# Patient Record
Sex: Female | Born: 1965 | Race: White | Hispanic: No | Marital: Single | Smoking: Never smoker
Health system: Southern US, Community
[De-identification: ages and names within clinical notes are randomized; demographics above are authoritative.]

## PROBLEM LIST (undated history)

## (undated) DIAGNOSIS — D259 Leiomyoma of uterus, unspecified: Secondary | ICD-10-CM

## (undated) DIAGNOSIS — K602 Anal fissure, unspecified: Secondary | ICD-10-CM

## (undated) DIAGNOSIS — F329 Major depressive disorder, single episode, unspecified: Secondary | ICD-10-CM

## (undated) DIAGNOSIS — F32A Depression, unspecified: Secondary | ICD-10-CM

## (undated) DIAGNOSIS — F419 Anxiety disorder, unspecified: Secondary | ICD-10-CM

## (undated) DIAGNOSIS — F909 Attention-deficit hyperactivity disorder, unspecified type: Secondary | ICD-10-CM

## (undated) DIAGNOSIS — K219 Gastro-esophageal reflux disease without esophagitis: Secondary | ICD-10-CM

## (undated) DIAGNOSIS — E785 Hyperlipidemia, unspecified: Secondary | ICD-10-CM

## (undated) HISTORY — PX: OTHER SURGICAL HISTORY: SHX169

## (undated) HISTORY — DX: Attention-deficit hyperactivity disorder, unspecified type: F90.9

## (undated) HISTORY — DX: Hyperlipidemia, unspecified: E78.5

## (undated) HISTORY — DX: Anxiety disorder, unspecified: F41.9

## (undated) HISTORY — PX: TONSILLECTOMY: SUR1361

## (undated) HISTORY — DX: Gastro-esophageal reflux disease without esophagitis: K21.9

## (undated) HISTORY — PX: UTERINE FIBROID SURGERY: SHX826

## (undated) HISTORY — DX: Major depressive disorder, single episode, unspecified: F32.9

## (undated) HISTORY — DX: Depression, unspecified: F32.A

## (undated) HISTORY — DX: Anal fissure, unspecified: K60.2

---

## 2007-10-02 ENCOUNTER — Emergency Department: Payer: Self-pay | Admitting: Emergency Medicine

## 2011-04-17 ENCOUNTER — Other Ambulatory Visit: Payer: Self-pay | Admitting: Internal Medicine

## 2011-04-17 DIAGNOSIS — Z1231 Encounter for screening mammogram for malignant neoplasm of breast: Secondary | ICD-10-CM

## 2011-12-25 ENCOUNTER — Ambulatory Visit
Admission: RE | Admit: 2011-12-25 | Discharge: 2011-12-25 | Disposition: A | Discharge status: Home / Self Care | Payer: Managed Care, Other (non HMO) | Source: Ambulatory Visit | Attending: Internal Medicine | Admitting: Internal Medicine

## 2011-12-25 DIAGNOSIS — Z1231 Encounter for screening mammogram for malignant neoplasm of breast: Secondary | ICD-10-CM

## 2011-12-31 ENCOUNTER — Other Ambulatory Visit: Payer: Self-pay | Admitting: Internal Medicine

## 2011-12-31 DIAGNOSIS — R928 Other abnormal and inconclusive findings on diagnostic imaging of breast: Secondary | ICD-10-CM

## 2012-01-06 ENCOUNTER — Ambulatory Visit
Admission: RE | Admit: 2012-01-06 | Discharge: 2012-01-06 | Disposition: A | Discharge status: Home / Self Care | Payer: Managed Care, Other (non HMO) | Source: Ambulatory Visit | Attending: Internal Medicine | Admitting: Internal Medicine

## 2012-01-06 DIAGNOSIS — R928 Other abnormal and inconclusive findings on diagnostic imaging of breast: Secondary | ICD-10-CM

## 2012-01-07 ENCOUNTER — Other Ambulatory Visit: Payer: Managed Care, Other (non HMO)

## 2013-02-16 ENCOUNTER — Ambulatory Visit: Payer: Self-pay | Admitting: Obstetrics and Gynecology

## 2013-02-16 LAB — PREGNANCY, URINE: Pregnancy Test, Urine: NEGATIVE m[IU]/mL

## 2013-02-17 ENCOUNTER — Ambulatory Visit: Payer: Self-pay | Admitting: Obstetrics and Gynecology

## 2014-10-17 ENCOUNTER — Encounter (HOSPITAL_COMMUNITY): Payer: Self-pay | Admitting: Emergency Medicine

## 2014-10-17 ENCOUNTER — Emergency Department (HOSPITAL_COMMUNITY)
Admission: EM | Admit: 2014-10-17 | Discharge: 2014-10-17 | Disposition: A | Discharge status: Home / Self Care | Payer: Managed Care, Other (non HMO) | Attending: Emergency Medicine | Admitting: Emergency Medicine

## 2014-10-17 DIAGNOSIS — R110 Nausea: Secondary | ICD-10-CM | POA: Diagnosis not present

## 2014-10-17 DIAGNOSIS — Z859 Personal history of malignant neoplasm, unspecified: Secondary | ICD-10-CM | POA: Diagnosis not present

## 2014-10-17 DIAGNOSIS — B029 Zoster without complications: Secondary | ICD-10-CM | POA: Insufficient documentation

## 2014-10-17 DIAGNOSIS — Z79899 Other long term (current) drug therapy: Secondary | ICD-10-CM | POA: Insufficient documentation

## 2014-10-17 DIAGNOSIS — R1031 Right lower quadrant pain: Secondary | ICD-10-CM | POA: Diagnosis present

## 2014-10-17 DIAGNOSIS — R21 Rash and other nonspecific skin eruption: Secondary | ICD-10-CM | POA: Insufficient documentation

## 2014-10-17 HISTORY — DX: Leiomyoma of uterus, unspecified: D25.9

## 2014-10-17 MED ORDER — GABAPENTIN 600 MG PO TABS
300.0000 mg | ORAL_TABLET | Freq: Once | ORAL | Status: DC
  Filled 2014-10-17: qty 0.5

## 2014-10-17 MED ORDER — VALACYCLOVIR HCL 1 G PO TABS: 1000.0000 mg | ORAL_TABLET | Freq: Three times a day (TID) | ORAL | Status: DC

## 2014-10-17 MED ORDER — GABAPENTIN 300 MG PO CAPS: 300.0000 mg | ORAL_CAPSULE | Freq: Once | ORAL | Status: DC

## 2014-10-17 MED ORDER — HYDROMORPHONE HCL 1 MG/ML IJ SOLN
2.0000 mg | Freq: Once | INTRAMUSCULAR | Status: AC
  Administered 2014-10-17: 2 mg via INTRAMUSCULAR
  Filled 2014-10-17: qty 2

## 2014-10-17 MED ORDER — ONDANSETRON 4 MG PO TBDP
4.0000 mg | ORAL_TABLET | Freq: Once | ORAL | Status: AC
  Administered 2014-10-17: 4 mg via ORAL
  Filled 2014-10-17: qty 1

## 2014-10-17 MED ORDER — VALACYCLOVIR HCL 500 MG PO TABS
1000.0000 mg | ORAL_TABLET | Freq: Once | ORAL | Status: AC
  Administered 2014-10-17: 1000 mg via ORAL
  Filled 2014-10-17: qty 2

## 2014-10-17 MED ORDER — OXYCODONE-ACETAMINOPHEN 5-325 MG PO TABS: 1.0000 | ORAL_TABLET | ORAL | Status: DC | PRN

## 2014-10-17 MED ORDER — ONDANSETRON 4 MG PO TBDP: 4.0000 mg | ORAL_TABLET | Freq: Three times a day (TID) | ORAL | Status: DC | PRN

## 2014-10-17 MED ORDER — GABAPENTIN 300 MG PO CAPS
300.0000 mg | ORAL_CAPSULE | Freq: Once | ORAL | Status: AC
  Administered 2014-10-17: 300 mg via ORAL
  Filled 2014-10-17: qty 1

## 2014-10-17 NOTE — ED Provider Notes (Signed)
  Face-to-face evaluation   History: She is here for evaluation of right flank pain that has been present for about one week.  In the last day she has noticed a rash.  The pain is described as "searing".  She has already been evaluated with a CT scan that was negative.  She is not responding to Norco, taken at home.  Physical exam: Alert, calm, uncomfortable. Right  L2-3 dermatomal vesicular rash on red base, consistent with shingles  Medical screening examination/treatment/procedure(s) were conducted as a shared visit with non-physician practitioner(s) and myself.  I personally evaluated the patient during the encounter   Richarda Blade, MD 10/18/14 1031

## 2014-10-17 NOTE — ED Notes (Signed)
Pt reports RLQ abd pain x 1 week. Pt reports that she went to [facility] to be evaluated for the same on Friday and was told nothing was abnormal on her CT scan. Pt now has a small red rash above the area of pain. Pt alert x4.

## 2014-10-17 NOTE — ED Provider Notes (Signed)
CSN: [id number]     Arrival date & time 10/17/14  1660 History   First MD Initiated Contact with Patient 10/17/14 0945     Chief Complaint  Patient presents with  . Abdominal Pain     (Consider location/radiation/quality/duration/timing/severity/associated sxs/prior Treatment) HPI Pt is a 48yo female presenting to ED with c/o gradually worsening RLQ pain that started about 1 week ago. Pt was seen on Friday, 12/4 at [facility] where she had a negative abdominal CT scan.  Pt states she developed a erythematous rash over the area of tenderness 1 week ago.  Pain is constant, burning, aching, stinging, 10/10, not relieved with Norco provided to her by [facility]. Reports intermittent mild nausea but no vomiting. Denies fever or chills. Denies urinary or vaginal symptoms.  Pt does report having chicken pox when she was younger. No known sick contacts or recent travel. Pt is otherwise healthy. Denies hx of immunodeficiency or diabetes.   Past Medical History  Diagnosis Date  . Uterine fibroid    Past Surgical History  Procedure Laterality Date  . Bladder polyp removal     . Tonsillectomy     No family history on file. History  Substance Use Topics  . Smoking status: Never Smoker   . Smokeless tobacco: Not on file  . Alcohol Use: Yes     Comment: occasionally    OB History    No data available     Review of Systems  Constitutional: Negative for fever, chills and appetite change.  Respiratory: Negative for cough and shortness of breath.   Cardiovascular: Negative for chest pain and palpitations.  Gastrointestinal: Positive for nausea and abdominal pain ( RLQ). Negative for vomiting and diarrhea.  Genitourinary: Negative for dysuria, urgency, frequency, hematuria, flank pain and pelvic pain.  All other systems reviewed and are negative.     Allergies  Morphine and related and Sulfamethoxazole-trimethoprim  Home Medications   Prior to Admission medications     Medication Sig Start Date End Date Taking? Authorizing Provider  amphetamine-dextroamphetamine (ADDERALL XR) 30 MG 24 hr capsule Take 30 mg by mouth daily.   Yes Historical Provider, MD  buPROPion (WELLBUTRIN XL) 150 MG 24 hr tablet Take 150 mg by mouth daily.   Yes Historical Provider, MD  gabapentin (NEURONTIN) 300 MG capsule Take 1 capsule (300 mg total) by mouth once. 300 mg orally on day 1, 300 mg twice daily on day 2, and 300 mg 3 times daily on day 3.  May increase to 1800mg /day (3 divided doses-6 tabs total) 10/17/14   Noland Fordyce, PA-C  HYDROcodone-acetaminophen (NORCO/VICODIN) 5-325 MG per tablet Take 1 tablet by mouth every 6 (six) hours as needed for moderate pain.   Yes Historical Provider, MD  ibuprofen (ADVIL,MOTRIN) 200 MG tablet Take 200 mg by mouth every 6 (six) hours as needed for mild pain.   Yes Historical Provider, MD  ondansetron (ZOFRAN) 4 MG tablet Take 4 mg by mouth every 8 (eight) hours as needed for nausea or vomiting.   Yes Historical Provider, MD  ondansetron (ZOFRAN-ODT) 4 MG disintegrating tablet Take 1 tablet (4 mg total) by mouth every 8 (eight) hours as needed for nausea or vomiting. 10/17/14   Noland Fordyce, PA-C  oxyCODONE-acetaminophen (PERCOCET/ROXICET) 5-325 MG per tablet Take 1-2 tablets by mouth every 4 (four) hours as needed for moderate pain or severe pain. 10/17/14   Noland Fordyce, PA-C  valACYclovir (VALTREX) 1000 MG tablet Take 1 tablet (1,000 mg total) by mouth 3 (three)  times daily. For 7 days 10/17/14   Noland Fordyce, PA-C  zolpidem (AMBIEN CR) 6.25 MG CR tablet Take 6.25 mg by mouth at bedtime as needed for sleep.   Yes Historical Provider, MD   BP 109/64 mmHg  Pulse 70  Temp(Src) 97.8 F (36.6 C) (Oral)  Resp 18  SpO2 99%  LMP 11/17/2013 Physical Exam  Constitutional: She appears well-developed and well-nourished. She appears distressed.  Tearful lying in exam bed, appears uncomfortable.  HENT:  Head: Normocephalic and atraumatic.  Eyes:  Conjunctivae are normal. No scleral icterus.  Neck: Normal range of motion. Neck supple.  Cardiovascular: Normal rate, regular rhythm and normal heart sounds.   Pulmonary/Chest: Effort normal and breath sounds normal. No respiratory distress. She has no wheezes. She has no rales. She exhibits no tenderness.  Abdominal: Soft. Bowel sounds are normal. She exhibits no distension and no mass. There is tenderness. There is no rebound and no guarding.  Rash along RLQ, see skin exam  Musculoskeletal: Normal range of motion. She exhibits tenderness. She exhibits no edema.  Neurological: She is alert.  Skin: Skin is warm and dry. Rash noted. She is not diaphoretic. There is erythema.     Vesicular, erythematous, grouping papular rash along L2 dermatome over RLQ, tender to touch and surround right side of abdomen c/w shingles  Nursing note and vitals reviewed.   ED Course  Procedures (including critical care time) Labs Review Labs Reviewed - No data to display  Imaging Review No results found.   EKG Interpretation None      MDM   Final diagnoses:  Herpes zoster  Shingles rash    pt is a 48yo female presenting to ED with c/o 1 week hx of gradually worsening RLQ pain with intermittent nausea, developed a painful rash yesterday. Normal CT abdomen and workup at [facility] on Friday, 10/13/14 per pt.  Pt is otherwise healthy. On exam, rash is c/w herpes zoster. Rash involves the L2 dermatome over RLQ and into right lower back.  Pt is tearful and uncomfrotable but afebrile, and non-toxic appearing.  Discussed pt with Dr. Eulis Foster, no indication for further evaluation with blood work or imaging as pt recently had negative workup, additional testing would not be of benefit as pain most likely due to herpes zoster.  Pt given IM dilaudid, zofran, gabapentin, and started on valtrex in ED.  Pain improved from 10/10 to 4/10 while in ED. Pt states she feels comfortable being discharged home. Will  discharge home with valtrex, gabapentin, percocet, and zofran. Home care instructions provided. Advised to f/u with her PCP 1 week if not improving. Advised pt it may take several weeks for pain to completely resolve. Discussed return precautions regarding secondary infection. Pt verbalized understanding and agreement with tx plan.   Noland Fordyce, PA-C 10/17/14 [place], MD 10/18/14 1031

## 2015-03-02 NOTE — Op Note (Signed)
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   PROCEDURE:  Right Bartholin's cyst marsupialization.   SURGEON: Will Bonnet, MD   ASSISTANT SURGEON: Barnett Applebaum, MD   ANESTHESIA:  MAC.  ESTIMATED BLOOD LOSS:  Minimal.   OPERATIVE FLUIDS:  600 mL crystalloid.   COMPLICATIONS: None.   FINDINGS:  Right Bartholin's cyst measuring approximately 2 x 2 cm, draining.  SPECIMENS:  None.  CONDITION:  Stable at the end of the procedure.   INDICATIONS FOR PROCEDURE: Nancy Kirk is a 49 year old female who has been seen numerous times in clinic for a recurrent right Bartholin's gland cyst and abscess.  She has undergone a Word catheter placement which remained successfully in place for a total of 4 weeks and was removed after 4 weeks.  She had some time where she had had no recurrence and then has had several recurrences which prior to surgical intervention have begun draining and were not amenable to surgical treatment.  She presented to the office yesterday in a great deal of pain, strongly desiring surgical management and was, therefore, taken to the operating room today.    PROCEDURE IN DETAIL:  After the patient was met in the preoperative area and her questions were answered, she was taken to the operating room.  There, she was placed under a monitored-type anesthesia which was found to be adequate.  She was then positioned in the dorsal supine high lithotomy position in the candy cane stirrups and then prepped and draped in the usual sterile fashion.  After a timeout was called, approximately 4 mL of a mixture of 1% lidocaine and 0.25% benzocaine without epinephrine and with a 1:1 ratio was injected in the overlying tissue of  interest over an approximately 2 cm x 2 cm area. An incision was made, and the cavity was entered.  An elliptical excision measuring approximately 1.5 x 1 cm was made unroofing the cyst. The epithelium of the cyst was then identified, and this was sutured to the surrounding vaginal and vulvar epithelium using 3-0 Vicryl interrupted sutures.  A total of approximately 8 sutures were thrown with good hemostasis noted.  The depth of the cyst cavity was approximately 1.5 cm, and it had been drained of all of its contents.  At this point, the procedure was terminated.    The patient tolerated the procedure well. Sponge, lap and needle counts were correct x2.  For VTE prophylaxis, she was wearing pneumatic compression stockings throughout the entire procedure.  She was awakened and taken to the recovery area in stable condition.        ____________________________ Will Bonnet, MD [email or web address] D: 02/17/2013 13:57:00 ET T: 02/17/2013 14:06:22 ET JOB#: [id number]  cc: Will Bonnet, MD, <Dictator> Will Bonnet MD ELECTRONICALLY SIGNED 03/10/2013 8:42

## 2015-07-06 ENCOUNTER — Other Ambulatory Visit: Payer: Self-pay

## 2015-07-06 DIAGNOSIS — F331 Major depressive disorder, recurrent, moderate: Secondary | ICD-10-CM

## 2015-07-06 MED ORDER — ZOLPIDEM TARTRATE ER 12.5 MG PO TBCR: 12.5000 mg | EXTENDED_RELEASE_TABLET | Freq: Every evening | ORAL | Status: DC | PRN

## 2015-07-06 NOTE — Telephone Encounter (Signed)
left message that rx called into pharmacy

## 2015-07-06 NOTE — Telephone Encounter (Signed)
received a fax requesting a refill on zolpidem er 12.5mg  . pt last seen on  03-27-15 rx was last sent on 03-30-15 x 2 refill. pt next appt 07-24-15.

## 2015-07-06 NOTE — Telephone Encounter (Signed)
called in rx order.  tried 3 times to fax order and each time it failed.  fax # correct. spoke with the pharmist "Pilar Plate"

## 2015-07-24 ENCOUNTER — Encounter: Payer: Self-pay | Admitting: Psychiatry

## 2015-07-24 ENCOUNTER — Ambulatory Visit (INDEPENDENT_AMBULATORY_CARE_PROVIDER_SITE_OTHER): Payer: 59 | Admitting: Psychiatry

## 2015-07-24 VITALS — BP 118/86 | HR 76 | Temp 98.0°F | Ht 64.0 in | Wt 127.0 lb

## 2015-07-24 DIAGNOSIS — F331 Major depressive disorder, recurrent, moderate: Secondary | ICD-10-CM | POA: Diagnosis not present

## 2015-07-24 DIAGNOSIS — G4700 Insomnia, unspecified: Secondary | ICD-10-CM | POA: Insufficient documentation

## 2015-07-24 MED ORDER — AMPHETAMINE-DEXTROAMPHET ER 30 MG PO CP24: 30.0000 mg | ORAL_CAPSULE | Freq: Every day | ORAL | Status: DC

## 2015-07-24 MED ORDER — ZOLPIDEM TARTRATE ER 12.5 MG PO TBCR: 12.5000 mg | EXTENDED_RELEASE_TABLET | Freq: Every evening | ORAL | Status: DC | PRN

## 2015-07-24 MED ORDER — BUPROPION HCL ER (XL) 150 MG PO TB24: 150.0000 mg | ORAL_TABLET | Freq: Every day | ORAL | Status: DC

## 2015-07-24 NOTE — Progress Notes (Signed)
[place] MD/PA/NP OP Progress Note  07/24/2015 10:58 AM JYL CHICO  MRN:  [id number]  Subjective:  Patient returns a follow-up of her ADHD, and major depressive disorder, recurrent moderate. She states that things are "same." She states that anxiety not gotten any worse. She states that she continues to go to work and finds it a high pressure job. 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 She states she has been active and done some high aches but is having some lower back pain which has slowed her down. She states she has not gone to the gym secondary to the back pain but desires to return to that activity. She states that her sleep is good with Ambien. She states that her energy is good. She states she continues to use the Adderall and it helps her focus. She states she does sometimes take a break from using on the weekends. She estimates that she uses the Ambien 5 nights a week. Chief Complaint: same Chief Complaint    Follow-up; Medication Refill     Visit Diagnosis:     ICD-9-CM ICD-10-CM   1. Major depressive disorder, recurrent episode, moderate 296.32 F33.1 zolpidem (AMBIEN CR) 12.5 MG CR tablet    Past Medical History:  Past Medical History  Diagnosis Date  . Uterine fibroid     Past Surgical History  Procedure Laterality Date  . Bladder polyp removal     . Tonsillectomy     Family History: No family history on file. Social History:  Social History   Social History  . Marital Status: Single    Spouse Name: N/A  . Number of Children: N/A  . Years of Education: N/A   Social History Main Topics  . Smoking status: Never Smoker   . Smokeless tobacco: Never Used  . Alcohol Use: 0.0 oz/week    0 Standard drinks or equivalent per week     Comment: occasionally   . Drug Use: No  . Sexual Activity: Not Asked   Other Topics Concern  . None   Social History Narrative   Additional History:   Assessment:   Musculoskeletal: Strength & Muscle  Tone: within normal limits Gait & Station: normal Patient leans: N/A  Psychiatric Specialty Exam: HPI  Review of Systems  Psychiatric/Behavioral: Negative for depression, suicidal ideas, hallucinations, memory loss and substance abuse. The patient is not nervous/anxious and does not have insomnia.     Last menstrual period 11/17/2013.There is no height or weight on file to calculate BMI.  General Appearance: Well Groomed  Eye Contact:  Good  Speech:  Normal Rate  Volume:  Normal  Mood:  Same  Affect:  Negative  Thought Process:  Linear  Orientation:  Full (Time, Place, and Person)  Thought Content:  Negative  Suicidal Thoughts:  No  Homicidal Thoughts:  No  Memory:  Immediate;   Good Recent;   Good Remote;   Good  Judgement:  Good  Insight:  Good  Psychomotor Activity:  Negative  Concentration:  Good  Recall:  Good  Fund of Knowledge: Good  Language: Good  Akathisia:  Negative  Handed:  Right  AIMS (if indicated):  N/A  Assets:  Communication Skills Desire for Improvement Vocational/Educational  ADL's:  Intact  Cognition: WNL  Sleep:  Good with ambien   Is the patient at risk to self?  No. Has the patient been a risk to self in the past 6 months?  No. Has the patient  been a risk to self within the distant past?  No. Is the patient a risk to others?  No. Has the patient been a risk to others in the past 6 months?  No. Has the patient been a risk to others within the distant past?  No.  Current Medications: Current Outpatient Prescriptions  Medication Sig Dispense Refill  . amphetamine-dextroamphetamine (ADDERALL XR) 30 MG 24 hr capsule Take 1 capsule (30 mg total) by mouth daily. 30 capsule 0  . buPROPion (WELLBUTRIN XL) 150 MG 24 hr tablet Take 1 tablet (150 mg total) by mouth daily. 30 tablet 4  . gabapentin (NEURONTIN) 300 MG capsule Take 1 capsule (300 mg total) by mouth once. 300 mg orally on day 1, 300 mg twice daily on day 2, and 300 mg 3 times daily on day  3.  May increase to 1800mg /day (3 divided doses-6 tabs total) 30 capsule 0  . HYDROcodone-acetaminophen (NORCO/VICODIN) 5-325 MG per tablet Take 1 tablet by mouth every 6 (six) hours as needed for moderate pain.    Marland Kitchen ibuprofen (ADVIL,MOTRIN) 200 MG tablet Take 200 mg by mouth every 6 (six) hours as needed for mild pain.    Marland Kitchen ondansetron (ZOFRAN) 4 MG tablet Take 4 mg by mouth every 8 (eight) hours as needed for nausea or vomiting.    . ondansetron (ZOFRAN-ODT) 4 MG disintegrating tablet Take 1 tablet (4 mg total) by mouth every 8 (eight) hours as needed for nausea or vomiting. 20 tablet 0  . oxyCODONE-acetaminophen (PERCOCET/ROXICET) 5-325 MG per tablet Take 1-2 tablets by mouth every 4 (four) hours as needed for moderate pain or severe pain. 20 tablet 0  . valACYclovir (VALTREX) 1000 MG tablet Take 1 tablet (1,000 mg total) by mouth 3 (three) times daily. For 7 days 20 tablet 0  . zolpidem (AMBIEN CR) 12.5 MG CR tablet Take 1 tablet (12.5 mg total) by mouth at bedtime as needed for sleep. 30 tablet 4   No current facility-administered medications for this visit.    Medical Decision Making:  Established Problem, Stable/Improving (1) and Review of Medication Regimen & Side Effects (2)  Treatment Plan Summary:Medication management and Plan Patient's major depressive disorder and ADHD and remains stable. We will continue her Wellbutrin XL 150 mg in the morning, Ambien CR 12.5 mg of bedtime and Adderall XR 30 mg daily. Patient follow up in 4 months. She's encouraged colony questions as prior to next point. She is aware she needs to call ahead to get her prescriptions for Adderall X are. I have given her 1 prescription today with an additional perception for her to fill in October. She left call for additional prescriptions for pickup.   Faith Rogue 07/24/2015, 10:58 AM

## 2015-11-23 ENCOUNTER — Ambulatory Visit (INDEPENDENT_AMBULATORY_CARE_PROVIDER_SITE_OTHER): Payer: 59 | Admitting: Psychiatry

## 2015-11-23 ENCOUNTER — Encounter: Payer: Self-pay | Admitting: Psychiatry

## 2015-11-23 VITALS — BP 110/80 | HR 80 | Temp 97.0°F | Ht 64.0 in | Wt 131.6 lb

## 2015-11-23 DIAGNOSIS — F331 Major depressive disorder, recurrent, moderate: Secondary | ICD-10-CM

## 2015-11-23 MED ORDER — BUSPIRONE HCL 10 MG PO TABS: ORAL_TABLET | ORAL | Status: DC

## 2015-11-23 MED ORDER — BUPROPION HCL ER (XL) 150 MG PO TB24: 150.0000 mg | ORAL_TABLET | Freq: Every day | ORAL | Status: DC

## 2015-11-23 MED ORDER — ZOLPIDEM TARTRATE ER 12.5 MG PO TBCR: 12.5000 mg | EXTENDED_RELEASE_TABLET | Freq: Every evening | ORAL | Status: DC | PRN

## 2015-11-23 MED ORDER — AMPHETAMINE-DEXTROAMPHET ER 30 MG PO CP24: 30.0000 mg | ORAL_CAPSULE | Freq: Every day | ORAL | Status: DC

## 2015-11-23 NOTE — Progress Notes (Signed)
[place] MD/PA/NP OP Progress Note  11/23/2015 12:25 PM Nancy Kirk  MRN:  [id number]  Subjective:  Patient returns a follow-up of her ADHD, and major depressive disorder, recurrent moderate. She states that the major stressor for her is that she has now ended a four-year relationship. She made contact with the [facility] clinic where she had a prior referral to by her previous psychiatrist. Saw someone in their couple of times but the cost and insurance coverage was an issue. She states that there is someone there that might be less expensive and she is trying to reconnect with them. She states that she wants to see why she might be drawn to significant others that are "narcissistic."  Is that her medications continue to work well for her. She does states she's having some anxiety related to the breakup and she requests BuSpar. She has been on BuSpar in the past and believes her previous dose was 15 mg twice a day. Chief Complaint: Anxiety Chief Complaint    Follow-up; Medication Refill     Visit Diagnosis:     ICD-9-CM ICD-10-CM   1. Major depressive disorder, recurrent episode, moderate (HCC) 296.32 F33.1 zolpidem (AMBIEN CR) 12.5 MG CR tablet    Past Medical History:  Past Medical History  Diagnosis Date  . Uterine fibroid   . ADHD (attention deficit hyperactivity disorder)   . Anxiety   . Depression     Past Surgical History  Procedure Laterality Date  . Bladder polyp removal     . Tonsillectomy     Family History:  Family History  Problem Relation Age of Onset  . Hypertension Mother   . Hyperlipidemia Mother   . Depression Mother   . Anxiety disorder Mother    Social History:  Social History   Social History  . Marital Status: Single    Spouse Name: N/A  . Number of Children: N/A  . Years of Education: N/A   Social History Main Topics  . Smoking status: Never Smoker   . Smokeless tobacco: Never Used  . Alcohol Use: 1.8 oz/week    0 Standard drinks or equivalent, 3  Glasses of wine, 0 Cans of beer, 0 Shots of liquor per week     Comment: occasionally   . Drug Use: No  . Sexual Activity: Yes    Birth Control/ Protection: None   Other Topics Concern  . None   Social History Narrative   Additional History:   Assessment:   Musculoskeletal: Strength & Muscle Tone: within normal limits Gait & Station: normal Patient leans: N/A  Psychiatric Specialty Exam: HPI  Review of Systems  Psychiatric/Behavioral: Negative for depression, suicidal ideas, hallucinations, memory loss and substance abuse. The patient is nervous/anxious. The patient does not have insomnia.   All other systems reviewed and are negative.   Blood pressure 110/80, pulse 80, temperature 97 F (36.1 C), temperature source Tympanic, height 5\' 4"  (1.626 m), weight 131 lb 9.6 oz (59.693 kg), last menstrual period 11/17/2013, SpO2 99 %.Body mass index is 22.58 kg/(m^2).  General Appearance: Well Groomed  Eye Contact:  Good  Speech:  Normal Rate  Volume:  Normal  Mood:  anxiety  Affect:  Constricted  Thought Process:  Linear  Orientation:  Full (Time, Place, and Person)  Thought Content:  Negative  Suicidal Thoughts:  No  Homicidal Thoughts:  No  Memory:  Immediate;   Good Recent;   Good Remote;   Good  Judgement:  Good  Insight:  Good  Psychomotor Activity:  Negative  Concentration:  Good  Recall:  Good  Fund of Knowledge: Good  Language: Good  Akathisia:  Negative  Handed:  Right  AIMS (if indicated):  N/A  Assets:  Communication Skills Desire for Improvement Vocational/Educational  ADL's:  Intact  Cognition: WNL  Sleep:  Good with ambien   Is the patient at risk to self?  No. Has the patient been a risk to self in the past 6 months?  No. Has the patient been a risk to self within the distant past?  No. Is the patient a risk to others?  No. Has the patient been a risk to others in the past 6 months?  No. Has the patient been a risk to others within the distant  past?  No.  Current Medications: Current Outpatient Prescriptions  Medication Sig Dispense Refill  . amphetamine-dextroamphetamine (ADDERALL XR) 30 MG 24 hr capsule Take 1 capsule (30 mg total) by mouth daily. 30 capsule 0  . buPROPion (WELLBUTRIN XL) 150 MG 24 hr tablet Take 1 tablet (150 mg total) by mouth daily. 30 tablet 4  . ibuprofen (ADVIL,MOTRIN) 200 MG tablet Take 200 mg by mouth every 6 (six) hours as needed for mild pain.    . traMADol (ULTRAM) 50 MG tablet TK 1 T PO Q 6 TO 8 H PRN P  1  . zolpidem (AMBIEN CR) 12.5 MG CR tablet Take 1 tablet (12.5 mg total) by mouth at bedtime as needed for sleep. 30 tablet 4  . busPIRone (BUSPAR) 10 MG tablet Take one half a tablet twice daily for one week then increase to one whole tablet twice daily. 60 tablet 4   No current facility-administered medications for this visit.    Medical Decision Making:  Established Problem, Stable/Improving (1) and Review of Medication Regimen & Side Effects (2)  Treatment Plan Summary:Medication management and Plan Patient's   major depressive disorder-we will continue the Wellbutrin XL and Ambien CR 12.5 mg at bedtime.   ADHD continue Adderall XR 30 mg daily. He has been given a prescription for this month and additional prescription for February. She is aware after that she'll have to contact the clinic to get additional refills.   Adjustment disorder with anxious features some BuSpar 5 mg twice a day for 1 week and then she'll go to 10 mg twice a day. Risk and benefits have been discussing patient's able to consent. Patient has been on medication before with some benefit.  Patient is already making efforts to obtain therapy to address relationship issues. I've also given her a list of therapists in the area and given her the contact information for her therapist in this clinic should her current pursuit of therapy as noted above not be successful.  He is aware of my departure in February. She's been  encouraged call any questions or concerns prior to next point. She is aware she'll follow-up with another provider within this clinic. She prefers to follow up in 3 months.  Faith Rogue 11/23/2015, 12:25 PM

## 2016-02-21 ENCOUNTER — Ambulatory Visit: Payer: 59 | Admitting: Psychiatry

## 2016-03-03 ENCOUNTER — Encounter: Payer: Self-pay | Admitting: Psychiatry

## 2016-03-03 ENCOUNTER — Ambulatory Visit: Payer: 59 | Admitting: Psychiatry

## 2016-03-03 ENCOUNTER — Ambulatory Visit (INDEPENDENT_AMBULATORY_CARE_PROVIDER_SITE_OTHER): Payer: 59 | Admitting: Psychiatry

## 2016-03-03 VITALS — BP 118/82 | HR 67 | Temp 97.0°F | Ht 64.0 in | Wt 135.6 lb

## 2016-03-03 DIAGNOSIS — F988 Other specified behavioral and emotional disorders with onset usually occurring in childhood and adolescence: Secondary | ICD-10-CM

## 2016-03-03 DIAGNOSIS — F909 Attention-deficit hyperactivity disorder, unspecified type: Secondary | ICD-10-CM | POA: Diagnosis not present

## 2016-03-03 DIAGNOSIS — F331 Major depressive disorder, recurrent, moderate: Secondary | ICD-10-CM

## 2016-03-03 MED ORDER — ZOLPIDEM TARTRATE ER 12.5 MG PO TBCR: 12.5000 mg | EXTENDED_RELEASE_TABLET | Freq: Every evening | ORAL | Status: DC | PRN

## 2016-03-03 MED ORDER — BUSPIRONE HCL 10 MG PO TABS
10.0000 mg | ORAL_TABLET | Freq: Two times a day (BID) | ORAL | Status: DC
Start: 2016-03-03 — End: 2016-08-11

## 2016-03-03 MED ORDER — BUPROPION HCL ER (XL) 150 MG PO TB24: 150.0000 mg | ORAL_TABLET | Freq: Every day | ORAL | Status: DC

## 2016-03-03 MED ORDER — AMPHETAMINE-DEXTROAMPHET ER 30 MG PO CP24: 30.0000 mg | ORAL_CAPSULE | Freq: Every day | ORAL | Status: DC

## 2016-03-03 NOTE — Progress Notes (Signed)
BH MD/PA/NP OP Progress Note  03/03/2016 10:56 AM Nancy Kirk  MRN:  [id number]  Subjective:  Patient  is a 50 year old female who presented for the follow-up appointment. She was previously following Dr. Jimmye Norman. 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 She reported that she has history of depression and ADHD. She is currently stable on her regimen of medications. She does not take Adderall on a regular basis. She sleeps well with the help of Ambien. Patient reported that she does not have any acute stressors at this time. She went to assist in [place] to watch the opera over the weekend. She currently lives with her mother. She stated that she used to follow with Dr. Nicolasa Ducking in the past but she did not like her. She appeared calm and collective during the interview. She currently denied having any suicidal ideations or plans. She reported that she does not have any issues with her anger anxiety or paranoia.    Chief Complaint: Anxiety Chief Complaint    Follow-up; Medication Refill     Visit Diagnosis:     ICD-9-CM ICD-10-CM   1. Major depressive disorder, recurrent episode, moderate (HCC) 296.32 F33.1   2. ADD (attention deficit disorder) 314.00 F90.9     Past Medical History:  Past Medical History  Diagnosis Date  . Uterine fibroid   . ADHD (attention deficit hyperactivity disorder)   . Anxiety   . Depression     Past Surgical History  Procedure Laterality Date  . Bladder polyp removal     . Tonsillectomy     Family History:  Family History  Problem Relation Age of Onset  . Hypertension Mother   . Hyperlipidemia Mother   . Depression Mother   . Anxiety disorder Mother    Social History:  Social History   Social History  . Marital Status: Single    Spouse Name: N/A  . Number of Children: N/A  . Years of Education: N/A   Social History Main Topics  . Smoking status: Never Smoker   . Smokeless tobacco: Never Used  . Alcohol Use: 1.8 oz/week    0 Standard drinks or equivalent, 3 Glasses of wine, 0 Cans of beer, 0 Shots of liquor per week     Comment: occasionally   . Drug Use: No  . Sexual Activity: Yes    Birth Control/ Protection: None   Other Topics Concern  . None   Social History Narrative   Additional History:   Assessment:   Musculoskeletal: Strength & Muscle Tone: within normal limits Gait & Station: normal Patient leans: N/A  Psychiatric Specialty Exam: HPI  Review of Systems  Psychiatric/Behavioral: Negative for depression, suicidal ideas, hallucinations, memory loss and substance abuse. The patient is nervous/anxious. The patient does not have insomnia.   All other systems reviewed and are negative.   Blood pressure 118/82, pulse 67, temperature 97 F (36.1 C), temperature source Tympanic, height 5\' 4"  (1.626 m), weight 135 lb 9.6 oz (61.508 kg), last menstrual period 11/17/2013, SpO2 98 %.Body mass index is 23.26 kg/(m^2).  General Appearance: Well Groomed  Eye Contact:  Good  Speech:  Normal Rate  Volume:  Normal  Mood:  anxiety  Affect:  Constricted  Thought Process:  Linear  Orientation:  Full (Time, Place, and Person)  Thought Content:  Negative  Suicidal Thoughts:  No  Homicidal Thoughts:  No  Memory:  Immediate;   Good Recent;   Good Remote;   Good  Judgement:  Good  Insight:  Good  Psychomotor Activity:  Negative  Concentration:  Good  Recall:  Good  Fund of Knowledge: Good  Language: Good  Akathisia:  Negative  Handed:  Right  AIMS (if indicated):  N/A  Assets:  Communication Skills Desire for Improvement Vocational/Educational  ADL's:  Intact  Cognition: WNL  Sleep:  Good with ambien   Is the patient at risk to self?  No. Has the patient been a risk to self in the past 6 months?  No. Has the patient been a risk to self within the distant past?  No. Is the patient a risk to others?  No. Has the patient been a risk to others in the past 6 months?  No. Has the patient been a  risk to others within the distant past?  No.  Current Medications: Current Outpatient Prescriptions  Medication Sig Dispense Refill  . amphetamine-dextroamphetamine (ADDERALL XR) 30 MG 24 hr capsule Take 1 capsule (30 mg total) by mouth daily. 30 capsule 0  . buPROPion (WELLBUTRIN XL) 150 MG 24 hr tablet Take 1 tablet (150 mg total) by mouth daily. 30 tablet 4  . busPIRone (BUSPAR) 10 MG tablet Take one half a tablet twice daily for one week then increase to one whole tablet twice daily. 60 tablet 4  . ibuprofen (ADVIL,MOTRIN) 200 MG tablet Take 200 mg by mouth every 6 (six) hours as needed for mild pain.    . traMADol (ULTRAM) 50 MG tablet TK 1 T PO Q 6 TO 8 H PRN P  1  . zolpidem (AMBIEN CR) 12.5 MG CR tablet Take 1 tablet (12.5 mg total) by mouth at bedtime as needed for sleep. 30 tablet 4   No current facility-administered medications for this visit.    Medical Decision Making:  Established Problem, Stable/Improving (1) and Review of Medication Regimen & Side Effects (2)  Treatment Plan Summary:Medication management and Plan Patient's   major depressive disorder-we will continue the Wellbutrin XL 150mg  po qam  and Ambien CR 12.5 mg at bedtime.   ADHD continue Adderall XR 30 mg daily.    She will continue with BuSpar 10 mg by mouth twice a day for her anxiety symptoms  Discussed with patient about her medications in detail. She reported that she is currently stable and does not want to make any adjustments.   More than 50% of the time spent in psychoeducation, counseling and coordination of care.    This note was generated in part or whole with voice recognition software. Voice regonition is usually quite accurate but there are transcription errors that can and very often do occur. I apologize for any typographical errors that were not detected and corrected.   Rainey Pines, MD    03/03/2016, 10:56 AM

## 2016-04-02 ENCOUNTER — Ambulatory Visit: Payer: 59 | Admitting: Psychiatry

## 2016-04-29 ENCOUNTER — Other Ambulatory Visit: Payer: Self-pay | Admitting: Internal Medicine

## 2016-04-29 DIAGNOSIS — Z1231 Encounter for screening mammogram for malignant neoplasm of breast: Secondary | ICD-10-CM

## 2016-05-15 ENCOUNTER — Ambulatory Visit
Admission: RE | Admit: 2016-05-15 | Discharge: 2016-05-15 | Disposition: A | Discharge status: Home / Self Care | Payer: Managed Care, Other (non HMO) | Source: Ambulatory Visit | Attending: Internal Medicine | Admitting: Internal Medicine

## 2016-05-15 DIAGNOSIS — Z1231 Encounter for screening mammogram for malignant neoplasm of breast: Secondary | ICD-10-CM

## 2016-08-11 ENCOUNTER — Ambulatory Visit (INDEPENDENT_AMBULATORY_CARE_PROVIDER_SITE_OTHER): Payer: 59 | Admitting: Psychiatry

## 2016-08-11 ENCOUNTER — Encounter: Payer: Self-pay | Admitting: Psychiatry

## 2016-08-11 VITALS — BP 109/70 | HR 71 | Temp 97.4°F | Wt 134.2 lb

## 2016-08-11 DIAGNOSIS — F331 Major depressive disorder, recurrent, moderate: Secondary | ICD-10-CM | POA: Diagnosis not present

## 2016-08-11 MED ORDER — ZOLPIDEM TARTRATE ER 6.25 MG PO TBCR: 6.2500 mg | EXTENDED_RELEASE_TABLET | Freq: Every evening | ORAL | 0 refills | Status: DC | PRN

## 2016-08-11 MED ORDER — ESCITALOPRAM OXALATE 10 MG PO TABS: 10.0000 mg | ORAL_TABLET | Freq: Every day | ORAL | 0 refills | Status: DC

## 2016-08-11 NOTE — Progress Notes (Signed)
BH MD/PA/NP OP Progress Note  08/11/2016 10:38 AM Nancy Kirk  MRN:  [id number]  Subjective:  Patient  is a 50 year old female who presented for the follow-up appointment. She was not evaluated since April. She reported that she was on the short-term leave because of the back pain. She started working again. She reported that she is currently on pain medications and is getting epidural injections as well as tramadol. She is not taking any psychotropic medications at this time. Patient came back for a follow-up appointment and is requesting refill of her medications. I checked her [place] controlled drug Registry She has been filling the Ambien and was minimizing the use. She reported that she might have old prescriptions of the medications as Dr. Jimmye Norman must have given her refills on the medication.   She reported that she has anxiety and she has discussed with her primary care physician about the anxiety. She is interested in switching the medications for her anxiety at this time. We discussed about starting her on Lexapro and she agreed. Then she started asking for the refill of Adderall. Patient has not refilled her Adderall in many months per the [facility] control drug registry. I offered her other medications including Strattera but she did not agree. She stated that she is struggling at work and wants the Adderall. She does not appear to have any symptoms of ADD at this time. 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  She currently denied having any suicidal ideations or plans. She reported that she does not have any issues with her anger mood symptoms  or paranoia.    Chief Complaint: Anxiety Chief Complaint    Follow-up; Medication Refill     Visit Diagnosis:     ICD-9-CM ICD-10-CM   1. MDD (major depressive disorder), recurrent episode, moderate (HCC) 296.32 F33.1     Past Medical History:  Past Medical History:  Diagnosis  Date  . ADHD (attention deficit hyperactivity disorder)   . Anxiety   . Depression   . Uterine fibroid     Past Surgical History:  Procedure Laterality Date  . bladder polyp removal     . TONSILLECTOMY     Family History:  Family History  Problem Relation Age of Onset  . Hypertension Mother   . Hyperlipidemia Mother   . Depression Mother   . Anxiety disorder Mother    Social History:  Social History   Social History  . Marital status: Single    Spouse name: N/A  . Number of children: N/A  . Years of education: N/A   Social History Main Topics  . Smoking status: Never Smoker  . Smokeless tobacco: Never Used  . Alcohol use 1.8 oz/week    3 Glasses of wine per week     Comment: occasionally   . Drug use: No  . Sexual activity: Yes    Birth control/ protection: None   Other Topics Concern  . None   Social History Narrative  . None   Additional History:   Assessment:   Musculoskeletal: Strength & Muscle Tone: within normal limits Gait & Station: normal Patient leans: N/A  Psychiatric Specialty Exam: HPI  Review of Systems  Psychiatric/Behavioral: Negative for depression, hallucinations, memory loss, substance abuse and suicidal ideas. The patient is nervous/anxious. The patient does not have insomnia.   All other systems reviewed and are negative.   Blood pressure 109/70, pulse 71, temperature 97.4 F (36.3  C), temperature source Oral, weight 134 lb 3.2 oz (60.9 kg), last menstrual period 11/17/2013.Body mass index is 23.04 kg/m.  General Appearance: Well Groomed  Eye Contact:  Good  Speech:  Normal Rate  Volume:  Normal  Mood:  anxiety  Affect:  Constricted  Thought Process:  Linear  Orientation:  Full (Time, Place, and Person)  Thought Content:  Negative  Suicidal Thoughts:  No  Homicidal Thoughts:  No  Memory:  Immediate;   Good Recent;   Good Remote;   Good  Judgement:  Good  Insight:  Good  Psychomotor Activity:  Negative  Concentration:   Good  Recall:  Good  Fund of Knowledge: Good  Language: Good  Akathisia:  Negative  Handed:  Right  AIMS (if indicated):  N/A  Assets:  Communication Skills Desire for Improvement Vocational/Educational  ADL's:  Intact  Cognition: WNL  Sleep:  Good with ambien   Is the patient at risk to self?  No. Has the patient been a risk to self in the past 6 months?  No. Has the patient been a risk to self within the distant past?  No. Is the patient a risk to others?  No. Has the patient been a risk to others in the past 6 months?  No. Has the patient been a risk to others within the distant past?  No.  Current Medications: Current Outpatient Prescriptions  Medication Sig Dispense Refill  . traMADol (ULTRAM) 50 MG tablet TK 1 T PO Q 6 TO 8 H PRN P  1  . escitalopram (LEXAPRO) 10 MG tablet Take 1 tablet (10 mg total) by mouth daily. 30 tablet 0  . zolpidem (AMBIEN CR) 6.25 MG CR tablet Take 1 tablet (6.25 mg total) by mouth at bedtime as needed for sleep. 30 tablet 0   No current facility-administered medications for this visit.     Medical Decision Making:  Established Problem, Stable/Improving (1) and Review of Medication Regimen & Side Effects (2)  Treatment Plan Summary:Medication management and Plan Patient's   She will be started on Lexapro 10 mg every daily to help with her anxiety symptoms. She will  given a prescription of Ambien CR 6.25 mg to help with her insomnia. I offered her Strattera to help with her ADD symptoms but she declined at this time.  Follow-up in one month or earlier depending on the symptoms   More than 50% of the time spent in psychoeducation, counseling and coordination of care.    This note was generated in part or whole with voice recognition software. Voice regonition is usually quite accurate but there are transcription errors that can and very often do occur. I apologize for any typographical errors that were not detected and corrected.   Rainey Pines, MD    08/11/2016, 10:38 AM

## 2016-09-07 ENCOUNTER — Other Ambulatory Visit: Payer: Self-pay | Admitting: Psychiatry

## 2016-12-01 ENCOUNTER — Encounter: Payer: Self-pay | Admitting: Emergency Medicine

## 2016-12-01 ENCOUNTER — Emergency Department: Payer: Managed Care, Other (non HMO)

## 2016-12-01 ENCOUNTER — Emergency Department
Admission: EM | Admit: 2016-12-01 | Discharge: 2016-12-01 | Disposition: A | Discharge status: Home / Self Care | Payer: Managed Care, Other (non HMO) | Attending: Emergency Medicine | Admitting: Emergency Medicine

## 2016-12-01 DIAGNOSIS — Z79899 Other long term (current) drug therapy: Secondary | ICD-10-CM | POA: Diagnosis not present

## 2016-12-01 DIAGNOSIS — R002 Palpitations: Secondary | ICD-10-CM | POA: Diagnosis not present

## 2016-12-01 DIAGNOSIS — H538 Other visual disturbances: Secondary | ICD-10-CM | POA: Insufficient documentation

## 2016-12-01 DIAGNOSIS — Z5321 Procedure and treatment not carried out due to patient leaving prior to being seen by health care provider: Secondary | ICD-10-CM | POA: Diagnosis not present

## 2016-12-01 DIAGNOSIS — R110 Nausea: Secondary | ICD-10-CM | POA: Diagnosis not present

## 2016-12-01 DIAGNOSIS — R531 Weakness: Secondary | ICD-10-CM | POA: Insufficient documentation

## 2016-12-01 DIAGNOSIS — F909 Attention-deficit hyperactivity disorder, unspecified type: Secondary | ICD-10-CM | POA: Insufficient documentation

## 2016-12-01 DIAGNOSIS — R51 Headache: Secondary | ICD-10-CM | POA: Insufficient documentation

## 2016-12-01 LAB — CBC
HCT: 44.3 % (ref 35.0–47.0)
HEMOGLOBIN: 14.9 g/dL (ref 12.0–16.0)
MCH: 29.8 pg (ref 26.0–34.0)
MCHC: 33.6 g/dL (ref 32.0–36.0)
MCV: 88.6 fL (ref 80.0–100.0)
Platelets: 240 10*3/uL (ref 150–440)
RBC: 5 MIL/uL (ref 3.80–5.20)
RDW: 13.6 % (ref 11.5–14.5)
WBC: 6.1 10*3/uL (ref 3.6–11.0)

## 2016-12-01 LAB — BASIC METABOLIC PANEL
ANION GAP: 8 (ref 5–15)
BUN: 11 mg/dL (ref 6–20)
CALCIUM: 9.1 mg/dL (ref 8.9–10.3)
CO2: 27 mmol/L (ref 22–32)
Chloride: 105 mmol/L (ref 101–111)
Creatinine, Ser: 0.77 mg/dL (ref 0.44–1.00)
Glucose, Bld: 127 mg/dL — ABNORMAL HIGH (ref 65–99)
POTASSIUM: 3.9 mmol/L (ref 3.5–5.1)
Sodium: 140 mmol/L (ref 135–145)

## 2016-12-01 LAB — TROPONIN I

## 2016-12-01 NOTE — ED Notes (Signed)
No answer when called several times from lobby 

## 2016-12-01 NOTE — ED Triage Notes (Signed)
Pt presents with her "heart doing something weird." She states that she has been using a kerosene heater and has felt weak with nausea and headache x 1 week. Pt states she was awakened by what felt like her heart skipping a beat and then "beating really hard" last night. She also reports that she has had some blurred vision in the last few days. She reports blurred vision in both eyes today. Pt alert & oriented with NAD noted.

## 2016-12-03 ENCOUNTER — Telehealth: Payer: Self-pay | Admitting: Emergency Medicine

## 2016-12-03 NOTE — Telephone Encounter (Signed)
Called patient due to lwot to inquire about condition and follow up plans. No answer. Left message

## 2017-03-02 ENCOUNTER — Ambulatory Visit: Payer: 59 | Admitting: Psychiatry

## 2017-03-06 ENCOUNTER — Encounter: Payer: Self-pay | Admitting: Psychiatry

## 2017-03-06 ENCOUNTER — Ambulatory Visit (INDEPENDENT_AMBULATORY_CARE_PROVIDER_SITE_OTHER): Payer: 59 | Admitting: Psychiatry

## 2017-04-29 ENCOUNTER — Encounter (INDEPENDENT_AMBULATORY_CARE_PROVIDER_SITE_OTHER): Payer: Self-pay

## 2017-04-29 ENCOUNTER — Encounter (HOSPITAL_COMMUNITY): Payer: Self-pay | Admitting: Psychiatry

## 2017-04-29 ENCOUNTER — Ambulatory Visit (INDEPENDENT_AMBULATORY_CARE_PROVIDER_SITE_OTHER): Payer: 59 | Admitting: Psychiatry

## 2017-04-29 VITALS — BP 108/70 | HR 80 | Ht 64.5 in | Wt 143.0 lb

## 2017-04-29 DIAGNOSIS — F411 Generalized anxiety disorder: Secondary | ICD-10-CM

## 2017-04-29 DIAGNOSIS — E669 Obesity, unspecified: Secondary | ICD-10-CM | POA: Diagnosis not present

## 2017-04-29 DIAGNOSIS — F1010 Alcohol abuse, uncomplicated: Secondary | ICD-10-CM | POA: Diagnosis not present

## 2017-04-29 DIAGNOSIS — Z818 Family history of other mental and behavioral disorders: Secondary | ICD-10-CM

## 2017-04-29 DIAGNOSIS — Z6824 Body mass index (BMI) 24.0-24.9, adult: Secondary | ICD-10-CM | POA: Diagnosis not present

## 2017-04-29 DIAGNOSIS — F331 Major depressive disorder, recurrent, moderate: Secondary | ICD-10-CM | POA: Diagnosis not present

## 2017-04-29 MED ORDER — NALTREXONE-BUPROPION HCL ER 8-90 MG PO TB12: 8.0000 mg | ORAL_TABLET | Freq: Two times a day (BID) | ORAL | 0 refills | Status: DC

## 2017-04-29 MED ORDER — BUSPIRONE HCL 10 MG PO TABS: 10.0000 mg | ORAL_TABLET | Freq: Two times a day (BID) | ORAL | 0 refills | Status: DC
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
## 2017-05-04 ENCOUNTER — Telehealth (HOSPITAL_COMMUNITY): Payer: Self-pay

## 2017-05-04 NOTE — Telephone Encounter (Signed)
Medication management - Telephone call with Tawni Pummel, representative with [employer] to initiate a prior authorization for patient's prescribed Contrave ER. Decision should be received in 24-48 hours.  ID [id number].

## 2017-05-05 NOTE — Telephone Encounter (Signed)
Medication management - Fax received back from [employer] that patient was not approved for Contrave medication because she did not meet coverage criteria.  It was denied for medical necessity as it requires (a) your BMI is >30, or both of the following (b) BMI >27 and you have an additional disorder related to weight (high cholesterol, hypertension, diabetes, sleep apnea).  Patient's most recent BMI was 24.17 so was not approved.  Message to provider for alternative.

## 2017-05-06 MED ORDER — BUPROPION HCL ER (XL) 150 MG PO TB24: 150.0000 mg | ORAL_TABLET | ORAL | 2 refills | Status: DC

## 2017-05-06 MED ORDER — NALTREXONE HCL 50 MG PO TABS: 25.0000 mg | ORAL_TABLET | Freq: Every day | ORAL | 2 refills | Status: DC

## 2017-05-06 NOTE — Telephone Encounter (Signed)
Okay, I sent in the 2 prescriptions and called the pharmacy to d/c the Contrave. Patient is aware and agreeable to the plan.

## 2017-05-06 NOTE — Telephone Encounter (Signed)
Sure, we can order the medicines individually.  She should take Wellbutrin 150 mg XL daily and Naltrexone 25 mg daily, and then follow-up with Dr. Adele Schilder as scheduled. Okay to send 30 days + 1 refill

## 2017-05-28 ENCOUNTER — Encounter (HOSPITAL_COMMUNITY): Payer: Self-pay | Admitting: Licensed Clinical Social Worker

## 2017-05-28 ENCOUNTER — Ambulatory Visit (INDEPENDENT_AMBULATORY_CARE_PROVIDER_SITE_OTHER): Payer: 59 | Admitting: Licensed Clinical Social Worker

## 2017-05-28 DIAGNOSIS — F1010 Alcohol abuse, uncomplicated: Secondary | ICD-10-CM

## 2017-05-28 DIAGNOSIS — F331 Major depressive disorder, recurrent, moderate: Secondary | ICD-10-CM
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
## 2017-06-04 ENCOUNTER — Other Ambulatory Visit (HOSPITAL_COMMUNITY): Payer: Self-pay | Admitting: Psychiatry

## 2017-06-04 ENCOUNTER — Telehealth (HOSPITAL_COMMUNITY): Payer: Self-pay

## 2017-06-04 DIAGNOSIS — F411 Generalized anxiety disorder: Secondary | ICD-10-CM

## 2017-06-04 DIAGNOSIS — F331 Major depressive disorder, recurrent, moderate: Secondary | ICD-10-CM

## 2017-06-04 MED ORDER — BUSPIRONE HCL 10 MG PO TABS: 10.0000 mg | ORAL_TABLET | Freq: Two times a day (BID) | ORAL | 0 refills | Status: DC

## 2017-06-04 NOTE — Telephone Encounter (Signed)
Medication refill request - fax received from [facility] in [place] for a refill of patient's prescribed Buspirone, last ordered 04/29/17 + 0 refills. Patient does not return until 06/09/17.

## 2017-06-04 NOTE — Telephone Encounter (Signed)
Done

## 2017-06-09 ENCOUNTER — Encounter (HOSPITAL_COMMUNITY): Payer: Self-pay | Admitting: Psychiatry

## 2017-06-09 ENCOUNTER — Ambulatory Visit (INDEPENDENT_AMBULATORY_CARE_PROVIDER_SITE_OTHER): Payer: 59 | Admitting: Psychiatry

## 2017-06-09 VITALS — BP 112/68 | HR 79 | Ht 64.0 in | Wt 144.4 lb

## 2017-06-09 DIAGNOSIS — F411 Generalized anxiety disorder: Secondary | ICD-10-CM | POA: Diagnosis not present

## 2017-06-09 DIAGNOSIS — F1010 Alcohol abuse, uncomplicated: Secondary | ICD-10-CM

## 2017-06-09 DIAGNOSIS — Z818 Family history of other mental and behavioral disorders: Secondary | ICD-10-CM

## 2017-06-09 DIAGNOSIS — F331 Major depressive disorder, recurrent, moderate: Secondary | ICD-10-CM | POA: Diagnosis not present

## 2017-06-09 MED ORDER — BUPROPION HCL ER (XL) 300 MG PO TB24: 300.0000 mg | ORAL_TABLET | ORAL | 1 refills | Status: DC

## 2017-06-09 MED ORDER — BUSPIRONE HCL 15 MG PO TABS: 15.0000 mg | ORAL_TABLET | Freq: Three times a day (TID) | ORAL | 1 refills | Status: DC
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
## 2017-06-23 ENCOUNTER — Encounter (HOSPITAL_COMMUNITY): Payer: Self-pay

## 2017-06-23 ENCOUNTER — Ambulatory Visit (HOSPITAL_COMMUNITY): Payer: 59 | Admitting: Licensed Clinical Social Worker

## 2017-06-24 ENCOUNTER — Ambulatory Visit (HOSPITAL_COMMUNITY): Payer: Self-pay | Admitting: Licensed Clinical Social Worker

## 2017-07-21 ENCOUNTER — Ambulatory Visit (INDEPENDENT_AMBULATORY_CARE_PROVIDER_SITE_OTHER): Payer: 59 | Admitting: Psychiatry

## 2017-07-21 ENCOUNTER — Encounter (HOSPITAL_COMMUNITY): Payer: Self-pay | Admitting: Psychiatry

## 2017-07-21 VITALS — BP 122/74 | HR 65 | Ht 64.0 in | Wt 147.8 lb

## 2017-07-21 DIAGNOSIS — F331 Major depressive disorder, recurrent, moderate: Secondary | ICD-10-CM

## 2017-07-21 DIAGNOSIS — F1010 Alcohol abuse, uncomplicated: Secondary | ICD-10-CM

## 2017-07-21 DIAGNOSIS — R450 Nervousness: Secondary | ICD-10-CM | POA: Diagnosis not present

## 2017-07-21 DIAGNOSIS — F411 Generalized anxiety disorder: Secondary | ICD-10-CM

## 2017-07-21 DIAGNOSIS — F900 Attention-deficit hyperactivity disorder, predominantly inattentive type: Secondary | ICD-10-CM

## 2017-07-21 DIAGNOSIS — Z658 Other specified problems related to psychosocial circumstances: Secondary | ICD-10-CM | POA: Diagnosis not present

## 2017-07-21 DIAGNOSIS — Z818 Family history of other mental and behavioral disorders: Secondary | ICD-10-CM

## 2017-07-21 DIAGNOSIS — Z91410 Personal history of adult physical and sexual abuse: Secondary | ICD-10-CM

## 2017-07-21 DIAGNOSIS — M549 Dorsalgia, unspecified: Secondary | ICD-10-CM

## 2017-07-21 DIAGNOSIS — M2550 Pain in unspecified joint: Secondary | ICD-10-CM | POA: Diagnosis not present

## 2017-07-21 MED ORDER — ATOMOXETINE HCL 25 MG PO CAPS: 25.0000 mg | ORAL_CAPSULE | Freq: Every day | ORAL | 1 refills | Status: AC

## 2017-07-21 MED ORDER — BUSPIRONE HCL 15 MG PO TABS: 15.0000 mg | ORAL_TABLET | Freq: Three times a day (TID) | ORAL | 1 refills | Status: AC

## 2017-07-21 MED ORDER — LAMOTRIGINE 25 MG PO TABS: ORAL_TABLET | ORAL | 1 refills | Status: DC
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
## 2017-09-08 ENCOUNTER — Ambulatory Visit (HOSPITAL_COMMUNITY): Payer: Self-pay | Admitting: Psychiatry

## 2017-11-25 ENCOUNTER — Other Ambulatory Visit: Payer: Self-pay | Admitting: Internal Medicine

## 2017-11-25 ENCOUNTER — Ambulatory Visit
Admission: RE | Admit: 2017-11-25 | Discharge: 2017-11-25 | Disposition: A | Discharge status: Home / Self Care | Payer: Managed Care, Other (non HMO) | Source: Ambulatory Visit | Attending: Internal Medicine | Admitting: Internal Medicine

## 2017-11-25 DIAGNOSIS — Z1231 Encounter for screening mammogram for malignant neoplasm of breast: Secondary | ICD-10-CM

## 2017-11-29 ENCOUNTER — Other Ambulatory Visit (HOSPITAL_COMMUNITY): Payer: Self-pay | Admitting: Psychiatry

## 2017-11-29 DIAGNOSIS — F331 Major depressive disorder, recurrent, moderate: Secondary | ICD-10-CM

## 2017-11-29 DIAGNOSIS — F411 Generalized anxiety disorder: Secondary | ICD-10-CM

## 2017-12-19 ENCOUNTER — Other Ambulatory Visit: Payer: Self-pay | Admitting: Neurological Surgery

## 2017-12-19 DIAGNOSIS — M47817 Spondylosis without myelopathy or radiculopathy, lumbosacral region: Secondary | ICD-10-CM

## 2018-01-02 ENCOUNTER — Ambulatory Visit
Admission: RE | Admit: 2018-01-02 | Discharge: 2018-01-02 | Disposition: A | Discharge status: Home / Self Care | Payer: Managed Care, Other (non HMO) | Source: Ambulatory Visit | Attending: Neurological Surgery | Admitting: Neurological Surgery

## 2018-01-02 DIAGNOSIS — M47817 Spondylosis without myelopathy or radiculopathy, lumbosacral region: Secondary | ICD-10-CM

## 2019-03-11 ENCOUNTER — Other Ambulatory Visit: Payer: Self-pay | Admitting: Internal Medicine

## 2019-03-11 DIAGNOSIS — Z1231 Encounter for screening mammogram for malignant neoplasm of breast: Secondary | ICD-10-CM

## 2019-05-04 ENCOUNTER — Ambulatory Visit: Payer: Managed Care, Other (non HMO)

## 2019-06-15 ENCOUNTER — Ambulatory Visit
Admission: RE | Admit: 2019-06-15 | Discharge: 2019-06-15 | Disposition: A | Discharge status: Home / Self Care | Payer: Managed Care, Other (non HMO) | Source: Ambulatory Visit | Attending: Internal Medicine | Admitting: Internal Medicine

## 2019-06-15 ENCOUNTER — Other Ambulatory Visit: Payer: Self-pay

## 2019-06-15 DIAGNOSIS — Z1231 Encounter for screening mammogram for malignant neoplasm of breast: Secondary | ICD-10-CM

## 2019-06-16 ENCOUNTER — Other Ambulatory Visit: Payer: Self-pay | Admitting: Internal Medicine

## 2019-06-16 DIAGNOSIS — R928 Other abnormal and inconclusive findings on diagnostic imaging of breast: Secondary | ICD-10-CM

## 2019-06-20 ENCOUNTER — Other Ambulatory Visit: Payer: Self-pay

## 2019-06-20 ENCOUNTER — Ambulatory Visit
Admission: RE | Admit: 2019-06-20 | Discharge: 2019-06-20 | Disposition: A | Discharge status: Home / Self Care | Payer: Managed Care, Other (non HMO) | Source: Ambulatory Visit | Attending: Internal Medicine | Admitting: Internal Medicine

## 2019-06-20 DIAGNOSIS — R928 Other abnormal and inconclusive findings on diagnostic imaging of breast: Secondary | ICD-10-CM

## 2019-10-28 IMAGING — MG DIGITAL DIAGNOSTIC UNILATERAL LEFT MAMMOGRAM WITH TOMO AND CAD
4 series · 4 of 12 positions shown · non-contrast
Comparison: Previous exam(s).

CLINICAL DATA: Screening recall for a possible left breast
distortion.

EXAM:
DIGITAL DIAGNOSTIC LEFT MAMMOGRAM WITH CAD AND TOMO
ULTRASOUND LEFT BREAST

[L ML synth-2D]
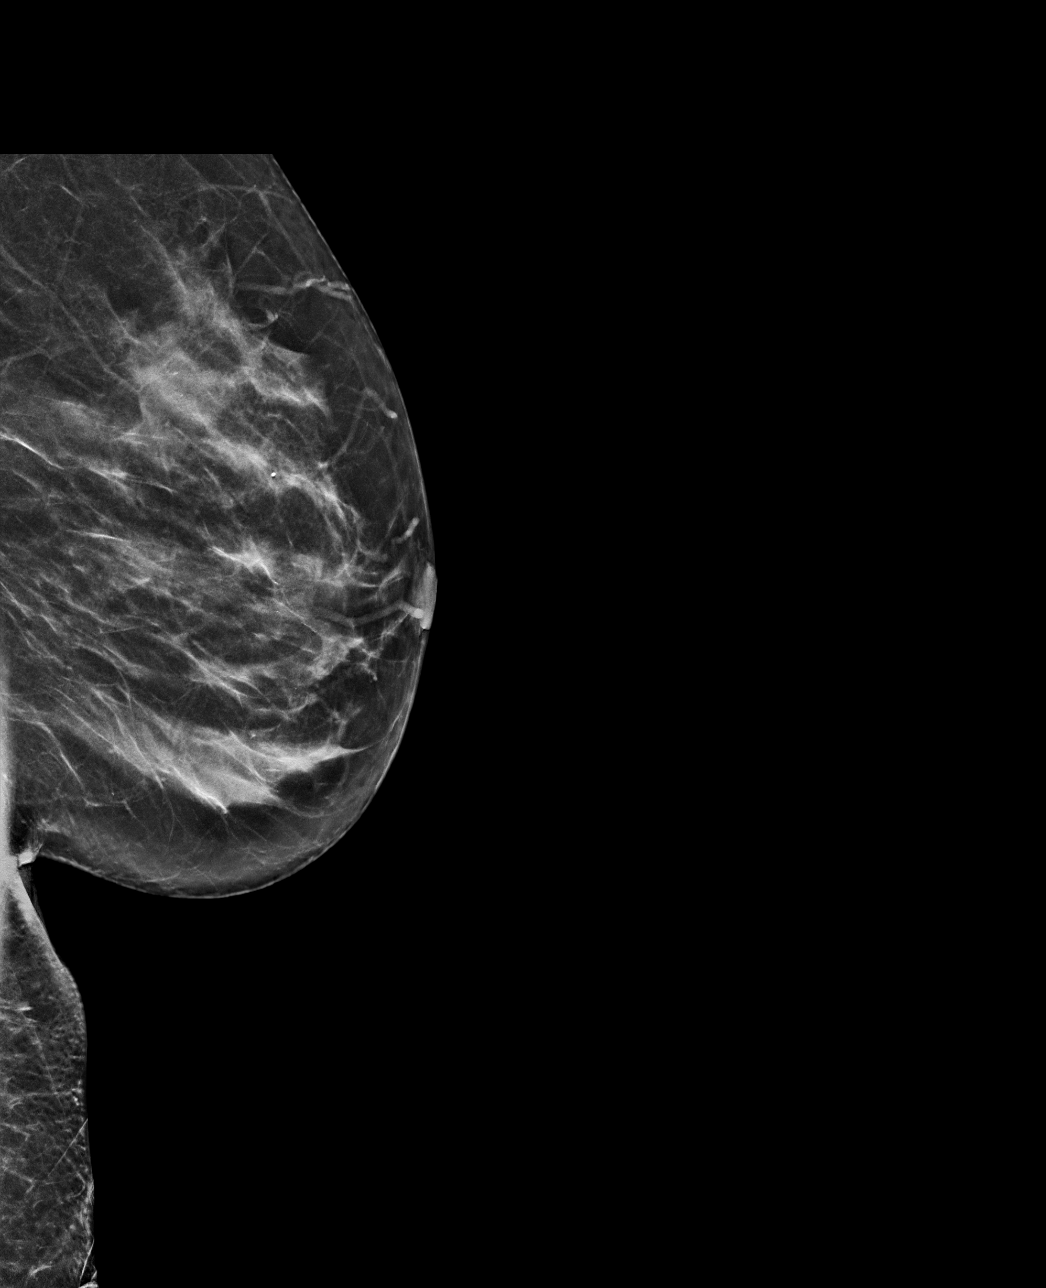

[L CC synth-2D]
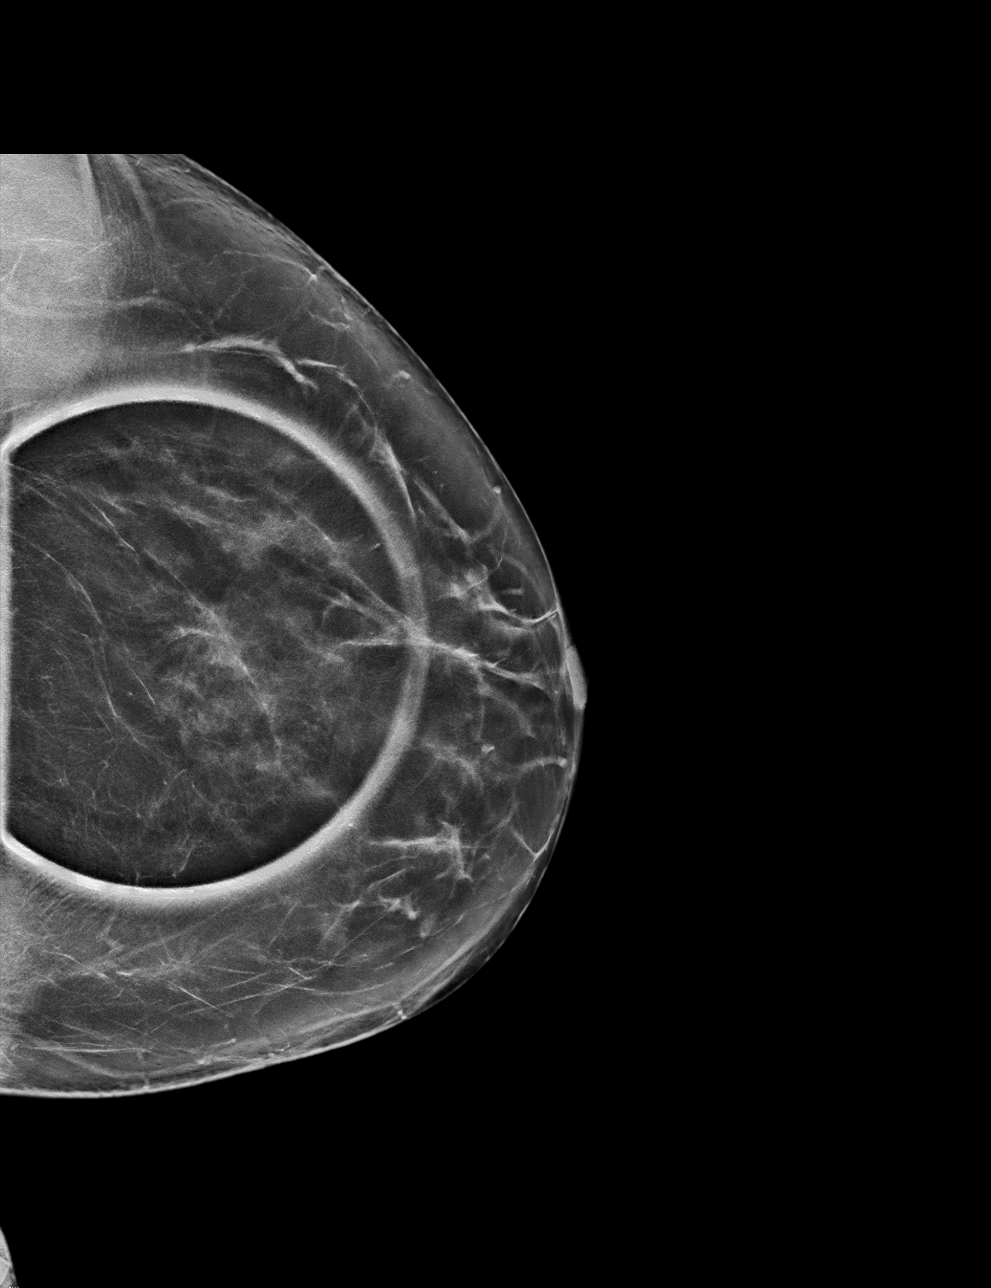

[L ML tomo · tomo slice 31/61.0]
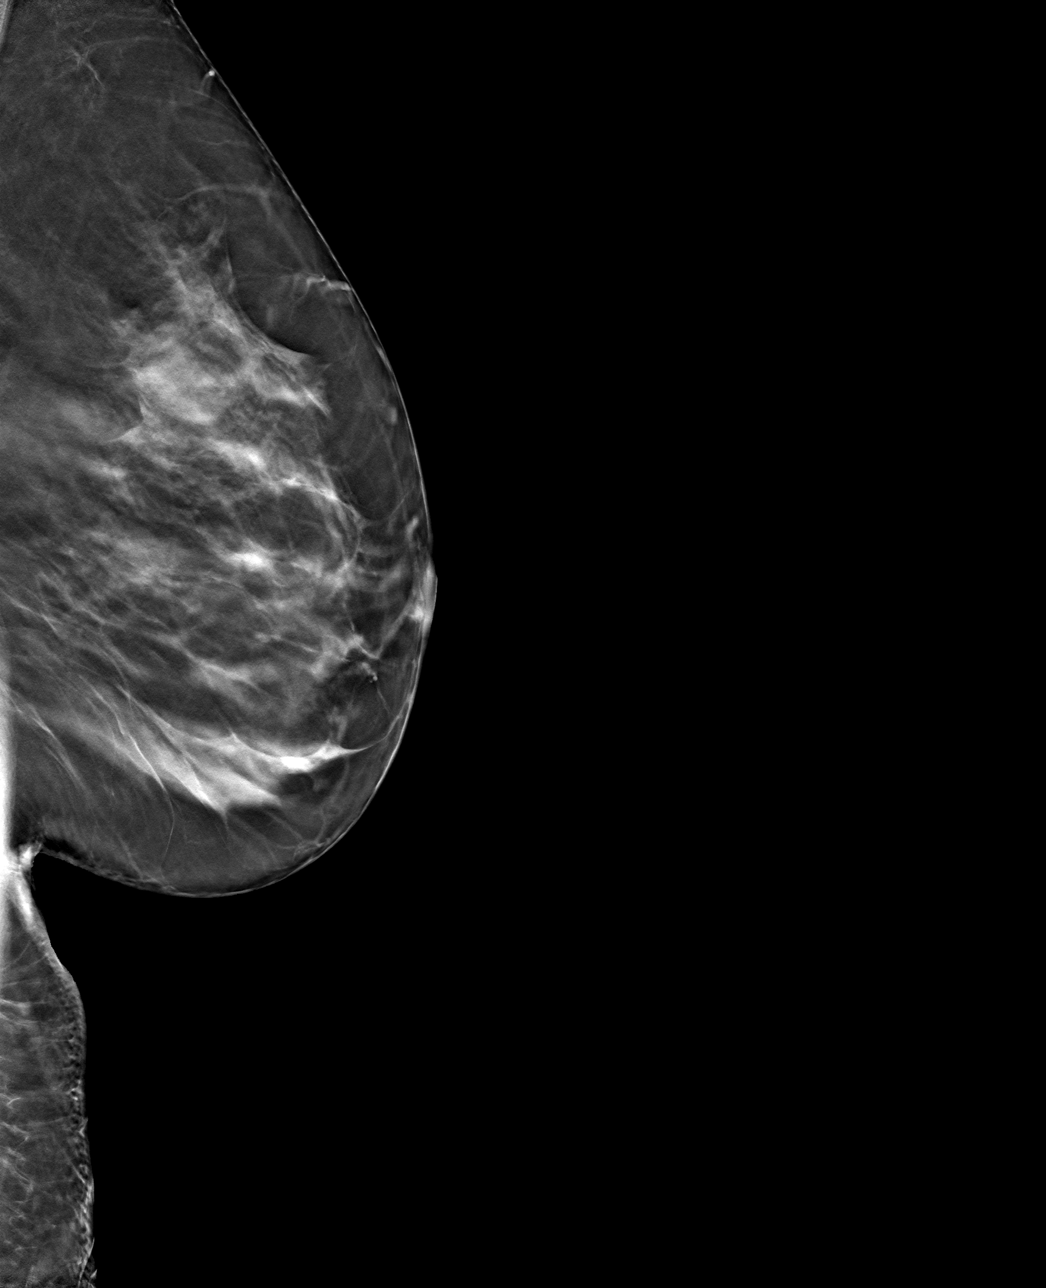

[L CC tomo · tomo slice 37/74.0]
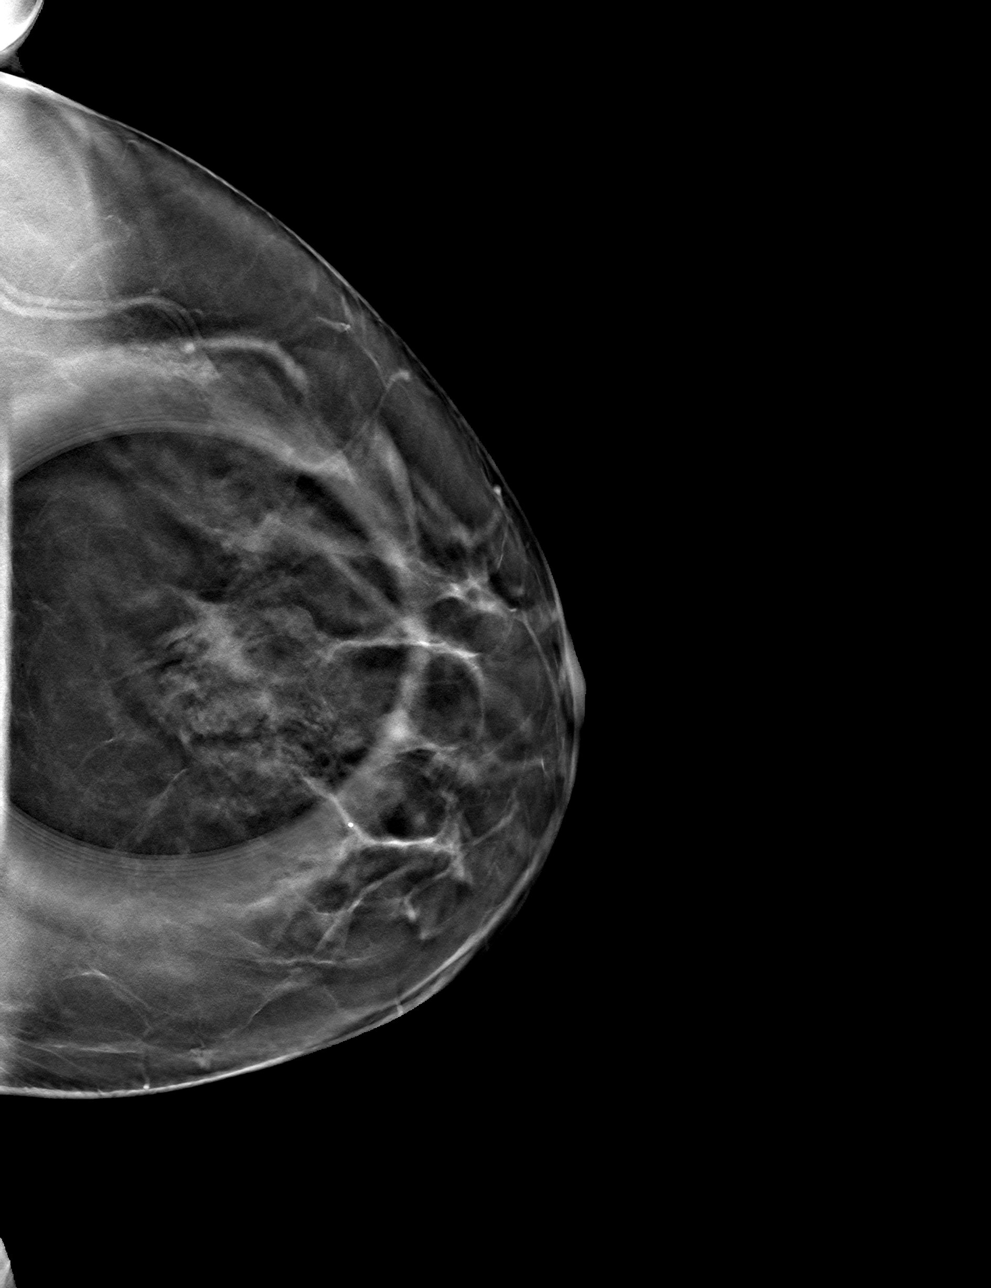

[4 of 12 positions shown; findings below may reference images not displayed]

ACR Breast Density Category c: The breast tissue is heterogeneously
dense, which may obscure small masses.
FINDINGS: Spot compression tomosynthesis images through the central left
breast demonstrate no definite persistent distortion. This region
appears similar to the patient's prior mammograms dating back to
5140. However, due to the density of the patient's breast tissue,
ultrasound will be performed for further evaluation.

Mammographic images were processed with CAD.

On physical exam, no suspicious palpable masses are identified in
the superior left breast.

Targeted ultrasound is performed, showing normal dense
fibroglandular tissue in the superior left breast. No suspicious
masses or areas of shadowing are identified.
IMPRESSION: There are no persistent mammographic or targeted sonographic
abnormalities in the superior left breast.

RECOMMENDATION:
Screening mammogram in one year.(Code:JX-[id number])

I have discussed the findings and recommendations with the patient.
Results were also provided in writing at the conclusion of the
visit. If applicable, a reminder letter will be sent to the patient
regarding the next appointment.

BI-RADS CATEGORY  1: Negative.

## 2020-05-03 ENCOUNTER — Other Ambulatory Visit: Payer: Self-pay

## 2020-05-03 ENCOUNTER — Ambulatory Visit (INDEPENDENT_AMBULATORY_CARE_PROVIDER_SITE_OTHER): Payer: Self-pay | Admitting: Dermatology

## 2020-05-03 DIAGNOSIS — L818 Other specified disorders of pigmentation: Secondary | ICD-10-CM

## 2020-05-03 DIAGNOSIS — L719 Rosacea, unspecified: Secondary | ICD-10-CM

## 2020-05-03 DIAGNOSIS — L988 Other specified disorders of the skin and subcutaneous tissue: Secondary | ICD-10-CM

## 2020-05-03 DIAGNOSIS — L821 Other seborrheic keratosis: Secondary | ICD-10-CM

## 2020-05-03 NOTE — Progress Notes (Signed)
Follow-Up Visit   Subjective  Nancy Kirk is a 54 y.o. female who presents for the following: Facial Elastosis (Patient is here today for Botox injections ) and cosmetic SK's (Patient states that the previously treated SK's are still present and she would like them treated today if possible).  The following portions of the chart were reviewed this encounter and updated as appropriate:  Tobacco  Allergies  Meds  Problems  Med Hx  Surg Hx  Fam Hx     Review of Systems:  No other skin or systemic complaints except as noted in HPI or Assessment and Plan.  Objective  Well appearing patient in no apparent distress; mood and affect are within normal limits.  A focused examination was performed including the face. Relevant physical exam findings are noted in the Assessment and Plan.  Objective  Face: Rhytides and volume loss.   Images                Objective  Face, neck, arms, and hands x 15: Stuck-on, waxy, tan-brown papules and plaques -- Discussed benign etiology and prognosis.   Objective  B/L arms and legs: Hypopigmented macules  Objective  Face: Erythema of the face  Assessment & Plan    Elastosis of skin Face  Botox 49 units injected as marked: - 25.0 units into the frown complex  - 7.5 units per each crow's feet  - 4 units to the upper lip - 5 units to the forehead  Discussed BBL (patient had laser treatment in the past) - patient to follow up with Lind Covert   Botox Injection - Face Location: See attached image  Informed consent: Discussed risks (infection, pain, bleeding, bruising, swelling, allergic reaction, paralysis of nearby muscles, eyelid droop, double vision, neck weakness, difficulty breathing, headache, undesirable cosmetic result, and need for additional treatment) and benefits of the procedure, as well as the alternatives.  Informed consent was obtained.  Preparation: The area was cleansed with alcohol.  Procedure  Details:  Botox was injected into the dermis with a 30-gauge needle. Pressure applied to any bleeding. Ice packs offered for swelling.  Lot Number:  [id number] Expiration:  07/2022  Total Units Injected:  49  Plan: Patient was instructed to remain upright for 4 hours. Patient was instructed to avoid massaging the face and avoid vigorous exercise for the rest of the day. Tylenol may be used for headache.  Allow 2 weeks before returning to clinic for additional dosing as needed. Patient will call for any problems.   Seborrheic keratosis Face, neck, arms, and hands x 15  Seborrheic keratosis - Removal desired by patient - Waxy tan macules - Benign appearing.  - Patient desires removal. Reviewed that this is not covered by insurance and they will be charged a cosmetic fee for removal. Patient gave verbal consent. - Prior to procedure, discussed risks of blister formation, small wound, skin dyspigmentation, or rare scar following cryotherapy.  PROCEDURE - Cryotherapy was performed to affected areas. The procedure was tolerated well. Wound care was reviewed with the patient. They were advised to call with any concerns. Total number of treated Sk's 15.  Advised patient that residual lesion can be treated at no cost.  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   Rosacea Face  Recommend BBL laser treatment with Lind Covert   Return in about 3  months (around 08/03/2020) for cosmetic - Botox.  Luther Redo, CMA, am acting as scribe for Sarina Ser, MD .  Documentation: I have reviewed the above documentation for accuracy and completeness, and I agree with the above.  Sarina Ser, MD

## 2020-05-08 ENCOUNTER — Encounter: Payer: Self-pay | Admitting: Dermatology

## 2020-07-19 ENCOUNTER — Ambulatory Visit: Payer: Managed Care, Other (non HMO) | Admitting: Dermatology

## 2020-09-05 ENCOUNTER — Ambulatory Visit: Payer: Managed Care, Other (non HMO) | Admitting: Dermatology

## 2021-01-24 ENCOUNTER — Other Ambulatory Visit: Payer: Self-pay

## 2021-01-24 ENCOUNTER — Ambulatory Visit: Payer: Managed Care, Other (non HMO) | Admitting: Dermatology

## 2021-01-24 DIAGNOSIS — L219 Seborrheic dermatitis, unspecified: Secondary | ICD-10-CM

## 2021-01-24 MED ORDER — CLOBETASOL PROPIONATE 0.05 % EX SOLN: 1.0000 "application " | Freq: Every day | CUTANEOUS | 2 refills | Status: DC

## 2021-01-24 MED ORDER — KETOCONAZOLE 2 % EX SHAM: 1.0000 "application " | MEDICATED_SHAMPOO | CUTANEOUS | 11 refills | Status: DC

## 2021-01-24 NOTE — Patient Instructions (Signed)
If you have any questions or concerns for your doctor, please call our main line at [phone] and press option 4 to reach your doctor's medical assistant. If no one answers, please leave a voicemail as directed and we will return your call as soon as possible. Messages left after 4 pm will be answered the following business day.   You may also send us a message via MyChart. We typically respond to MyChart messages within 1-2 business days.  For prescription refills, please ask your pharmacy to contact our office. Our fax number is [phone].  If you have an urgent issue when the clinic is closed that cannot wait until the next business day, you can page your doctor at the number below.    Please note that while we do our best to be available for urgent issues outside of office hours, we are not available 24/7.   If you have an urgent issue and are unable to reach us, you may choose to seek medical care at your doctor's office, retail clinic, urgent care center, or emergency room.  If you have a medical emergency, please immediately call 911 or go to the emergency department.  Pager Numbers  - Dr. Kowalski: [phone]  - Dr. Moye: [phone]  - Dr. Stewart: [phone]  In the event of inclement weather, please call our main line at [phone] for an update on the status of any delays or closures.  Dermatology Medication Tips: Please keep the boxes that topical medications come in in order to help keep track of the instructions about where and how to use these. Pharmacies typically print the medication instructions only on the boxes and not directly on the medication tubes.   If your medication is too expensive, please contact our office at [phone] option 4 or send us a message through MyChart.   We are unable to tell what your co-pay for medications will be in advance as this is different depending on your insurance coverage. However, we may be able to find a substitute  medication at lower cost or fill out paperwork to get insurance to cover a needed medication.   If a prior authorization is required to get your medication covered by your insurance company, please allow us 1-2 business days to complete this process.  Drug prices often vary depending on where the prescription is filled and some pharmacies may offer cheaper prices.  The website www.goodrx.com contains coupons for medications through different pharmacies. The prices here do not account for what the cost may be with help from insurance (it may be cheaper with your insurance), but the website can give you the price if you did not use any insurance.  - You can print the associated coupon and take it with your prescription to the pharmacy.  - You may also stop by our office during regular business hours and pick up a GoodRx coupon card.  - If you need your prescription sent electronically to a different pharmacy, notify our office through [facility] MyChart or by phone at [phone] option 4.  Topical steroids (such as triamcinolone, fluocinolone, fluocinonide, mometasone, clobetasol, halobetasol, betamethasone, hydrocortisone) can cause thinning and lightening of the skin if they are used for too long in the same area. Your physician has selected the right strength medicine for your problem and area affected on the body. Please use your medication only as directed by your physician to prevent side effects.  

## 2021-01-24 NOTE — Progress Notes (Signed)
   Follow-Up Visit   Subjective  Nancy Kirk is a 55 y.o. female who presents for the following: Pruritis (Of scalp for a while that wakes her up at night. Treating with Nizoral and Selsun blue shampoos.).    The following portions of the chart were reviewed this encounter and updated as appropriate:   Tobacco  Allergies  Meds  Problems  Med Hx  Surg Hx  Fam Hx      Review of Systems:  No other skin or systemic complaints except as noted in HPI or Assessment and Plan.  Objective  Well appearing patient in no apparent distress; mood and affect are within normal limits.  A focused examination was performed including scalp,ears, face. Relevant physical exam findings are noted in the Assessment and Plan.  Objective  Scalp: Pink patches with greasy scale.    Assessment & Plan  Seborrheic dermatitis Scalp  Chronic condition with duration or expected duration over one year. Condition is bothersome to patient. Currently flared.   Start Ketoconazole 2% shampoo apply three times per week, massage into scalp and leave in for 10 minutes before rinsing out  Start Clobetasol solution qd prn itch  ketoconazole (NIZORAL) 2 % shampoo - Scalp  clobetasol (TEMOVATE) 0.05 % external solution - Scalp  Return in about 2 months (around 03/26/2021) for Seb Derm and TBSE.  I, Ashok Cordia, CMA, am acting as scribe for Forest Gleason, MD .  Documentation: I have reviewed the above documentation for accuracy and completeness, and I agree with the above.  Forest Gleason, MD

## 2021-02-04 ENCOUNTER — Encounter: Payer: Self-pay | Admitting: Dermatology

## 2021-03-10 ENCOUNTER — Other Ambulatory Visit: Payer: Self-pay | Admitting: Dermatology

## 2021-03-10 DIAGNOSIS — L219 Seborrheic dermatitis, unspecified: Secondary | ICD-10-CM

## 2021-04-11 ENCOUNTER — Other Ambulatory Visit: Payer: Self-pay

## 2021-04-11 ENCOUNTER — Ambulatory Visit: Payer: Managed Care, Other (non HMO) | Admitting: Dermatology

## 2021-04-11 ENCOUNTER — Other Ambulatory Visit: Payer: Self-pay | Admitting: Physical Medicine and Rehabilitation

## 2021-04-11 DIAGNOSIS — Z1283 Encounter for screening for malignant neoplasm of skin: Secondary | ICD-10-CM | POA: Diagnosis not present

## 2021-04-11 DIAGNOSIS — L821 Other seborrheic keratosis: Secondary | ICD-10-CM

## 2021-04-11 DIAGNOSIS — L814 Other melanin hyperpigmentation: Secondary | ICD-10-CM

## 2021-04-11 DIAGNOSIS — M545 Low back pain, unspecified: Secondary | ICD-10-CM

## 2021-04-11 DIAGNOSIS — L219 Seborrheic dermatitis, unspecified: Secondary | ICD-10-CM

## 2021-04-11 DIAGNOSIS — Z808 Family history of malignant neoplasm of other organs or systems: Secondary | ICD-10-CM

## 2021-04-11 DIAGNOSIS — L578 Other skin changes due to chronic exposure to nonionizing radiation: Secondary | ICD-10-CM

## 2021-04-11 DIAGNOSIS — D1800 Hemangioma unspecified site: Secondary | ICD-10-CM

## 2021-04-11 DIAGNOSIS — G8929 Other chronic pain: Secondary | ICD-10-CM

## 2021-04-11 DIAGNOSIS — D229 Melanocytic nevi, unspecified: Secondary | ICD-10-CM

## 2021-04-11 MED ORDER — CLOBETASOL PROPIONATE 0.05 % EX SOLN: CUTANEOUS | 2 refills | Status: DC

## 2021-04-11 MED ORDER — CICLOPIROX OLAMINE 0.77 % EX SUSP: 1.0000 "application " | Freq: Every day | CUTANEOUS | 11 refills | Status: DC

## 2021-04-11 NOTE — Progress Notes (Signed)
Follow-Up Visit   Subjective  Nancy Kirk is a 55 y.o. female who presents for the following: Seborrheic Dermatitis (Patient here is for 2 month follow up for seborrheic dermatitis. She was prescribed clobetasol solution 0.05 % and ketoconazole 2 % shampoo. She states she is still having problems with itchy scalp and would like to discuss other treatment options. ) and tbse (Patient here today for tbse. Patient has family history of skin cancer. She denies personal history. She states she has no new concerns today. ).  Patient here for full body skin exam and skin cancer screening.  The following portions of the chart were reviewed this encounter and updated as appropriate:  Tobacco  Allergies  Meds  Problems  Med Hx  Surg Hx  Fam Hx       Objective  Well appearing patient in no apparent distress; mood and affect are within normal limits.  A full examination was performed including scalp, head, eyes, ears, nose, lips, neck, chest, axillae, abdomen, back, buttocks, bilateral upper extremities, bilateral lower extremities, hands, feet, fingers, toes, fingernails, and toenails. All findings within normal limits unless otherwise noted below.  Scalp Pink patches with greasy scale.   Assessment & Plan  Seborrheic dermatitis Scalp  Chronic condition with duration or expected duration over one year. Condition is bothersome to patient. Not currently at goal.  Start Ciclopirox suspension 60 ml, apply daily to once weekly as needed to keep condition under control Continue clobetasol 0.05 % solution daily prn (pt declined foam). Avoid applying to face, groin, and axilla. Use as directed. Risk of skin atrophy with long-term use reviewed.   Continue Ketoconazole 2 % shampoo as needed apply three times per week, massage into scalp and leave in for 10 minutes before rinsing out     ciclopirox (LOPROX) 0.77 % SUSP - Scalp Apply 1 application topically daily. And as needed  weekly  Related Medications ketoconazole (NIZORAL) 2 % shampoo Apply 1 application topically 3 (three) times a week.  clobetasol (TEMOVATE) 0.05 % external solution APPLY TOPICALLY TO THE AFFECTED AREA DAILY AS NEEDED FOR ITCHING. AVOID FACE, GROIN, UNDERARMS   Lentigines - Scattered tan macules - Due to sun exposure - Benign-appering, observe - Recommend daily broad spectrum sunscreen SPF 30+ to sun-exposed areas, reapply every 2 hours as needed. - Call for any changes  Seborrheic Keratoses - Stuck-on, waxy, tan-brown papules and/or plaques  - Benign-appearing - Discussed benign etiology and prognosis. - Observe - Call for any changes  Melanocytic Nevi - Tan-brown and/or pink-flesh-colored symmetric macules and papules - Benign appearing on exam today - Observation - Call clinic for new or changing moles - Recommend daily use of broad spectrum spf 30+ sunscreen to sun-exposed areas.   Hemangiomas - Red papules - Discussed benign nature - Observe - Call for any changes  Actinic Damage - Chronic condition, secondary to cumulative UV/sun exposure - diffuse scaly erythematous macules with underlying dyspigmentation - Recommend daily broad spectrum sunscreen SPF 30+ to sun-exposed areas, reapply every 2 hours as needed.  - Staying in the shade or wearing long sleeves, sun glasses (UVA+UVB protection) and wide brim hats (4-inch brim around the entire circumference of the hat) are also recommended for sun protection.  - Call for new or changing lesions.  Skin cancer screening performed today.  Return in about 1 year (around 04/11/2022) for tbse and seb derm follow up. Call if seborrheic dermatitis not doing well with adjustments to treatment.   I, Ruthell Rummage,  CMA, am acting as scribe for Forest Gleason, MD. Documentation: I have reviewed the above documentation for accuracy and completeness, and I agree with the above.  Forest Gleason, MD

## 2021-04-11 NOTE — Patient Instructions (Addendum)
Topical steroids (such as triamcinolone, fluocinolone, fluocinonide, mometasone, clobetasol, halobetasol, betamethasone, hydrocortisone) can cause thinning and lightening of the skin if they are used for too long in the same area. Your physician has selected the right strength medicine for your problem and area affected on the body. Please use your medication only as directed by your physician to prevent side effects.   Melanoma ABCDEs  Melanoma is the most dangerous type of skin cancer, and is the leading cause of death from skin disease.  You are more likely to develop melanoma if you:  Have light-colored skin, light-colored eyes, or red or blond hair  Spend a lot of time in the sun  Tan regularly, either outdoors or in a tanning bed  Have had blistering sunburns, especially during childhood  Have a close family member who has had a melanoma  Have atypical moles or large birthmarks  Early detection of melanoma is key since treatment is typically straightforward and cure rates are extremely high if we catch it early.   The first sign of melanoma is often a change in a mole or a new dark spot.  The ABCDE system is a way of remembering the signs of melanoma.  A for asymmetry:  The two halves do not match. B for border:  The edges of the growth are irregular. C for color:  A mixture of colors are present instead of an even brown color. D for diameter:  Melanomas are usually (but not always) greater than 39mm - the size of a pencil eraser. E for evolution:  The spot keeps changing in size, shape, and color.  Please check your skin once per month between visits. You can use a small mirror in front and a large mirror behind you to keep an eye on the back side or your body.   If you see any new or changing lesions before your next follow-up, please call to schedule a visit.  Please continue daily skin protection including broad spectrum sunscreen SPF 30+ to sun-exposed areas, reapplying  every 2 hours as needed when you're outdoors.   Staying in the shade or wearing long sleeves, sun glasses (UVA+UVB protection) and wide brim hats (4-inch brim around the entire circumference of the hat) are also recommended for sun protection.   Recommend taking Heliocare sun protection supplement daily in sunny weather for additional sun protection. For maximum protection on the sunniest days, you can take up to 2 capsules of regular Heliocare OR take 1 capsule of Heliocare Ultra. For prolonged exposure (such as a full day in the sun), you can repeat your dose of the supplement 4 hours after your first dose. Heliocare can be purchased at [facility] or at [email or web address].   If you have any questions or concerns for your doctor, please call our main line at [phone] and press option 4 to reach your doctor's medical assistant. If no one answers, please leave a voicemail as directed and we will return your call as soon as possible. Messages left after 4 pm will be answered the following business day.   You may also send [place] a message via [facility]. We typically respond to MyChart messages within 1-2 business days.  For prescription refills, please ask your pharmacy to contact our office. Our fax number is [phone].  If you have an urgent issue when the clinic is closed that cannot wait until the next business day, you can page your doctor at the number below.    Please note  that while we do our best to be available for urgent issues outside of office hours, we are not available 24/7.   If you have an urgent issue and are unable to reach [place], you may choose to seek medical care at your doctor's office, retail clinic, urgent care center, or emergency room.  If you have a medical emergency, please immediately call 911 or go to the emergency department.  Pager Numbers  - Dr. Nehemiah Massed: [phone]  - Dr. Laurence Ferrari: [phone]  - Dr. Nicole Kindred: [phone]  In the event of inclement  weather, please call our main line at [phone] for an update on the status of any delays or closures.  Dermatology Medication Tips: Please keep the boxes that topical medications come in in order to help keep track of the instructions about where and how to use these. Pharmacies typically print the medication instructions only on the boxes and not directly on the medication tubes.   If your medication is too expensive, please contact our office at [phone] option 4 or send [place] a message through [facility].   We are unable to tell what your co-pay for medications will be in advance as this is different depending on your insurance coverage. However, we may be able to find a substitute medication at lower cost or fill out paperwork to get insurance to cover a needed medication.   If a prior authorization is required to get your medication covered by your insurance company, please allow [place] 1-2 business days to complete this process.  Drug prices often vary depending on where the prescription is filled and some pharmacies may offer cheaper prices.  The website www.goodrx.com contains coupons for medications through different pharmacies. The prices here do not account for what the cost may be with help from insurance (it may be cheaper with your insurance), but the website can give you the price if you did not use any insurance.  - You can print the associated coupon and take it with your prescription to the pharmacy.  - You may also stop by our office during regular business hours and pick up a GoodRx coupon card.  - If you need your prescription sent electronically to a different pharmacy, notify our office through [facility] or by phone at [phone] option 4.

## 2021-04-22 ENCOUNTER — Encounter: Payer: Self-pay | Admitting: Dermatology

## 2021-07-11 ENCOUNTER — Ambulatory Visit: Payer: Managed Care, Other (non HMO) | Admitting: Dermatology

## 2021-07-11 ENCOUNTER — Other Ambulatory Visit: Payer: Self-pay

## 2021-07-11 DIAGNOSIS — R21 Rash and other nonspecific skin eruption: Secondary | ICD-10-CM

## 2021-07-11 MED ORDER — IVERMECTIN 3 MG PO TABS: ORAL_TABLET | ORAL | 0 refills | Status: DC

## 2021-07-11 MED ORDER — PERMETHRIN 5 % EX CREA: 1.0000 "application " | TOPICAL_CREAM | Freq: Once | CUTANEOUS | 1 refills | Status: AC

## 2021-07-11 MED ORDER — PREDNISONE 5 MG PO TABS: ORAL_TABLET | ORAL | 0 refills | Status: DC

## 2021-07-11 NOTE — Patient Instructions (Addendum)
Start prednisone 2 week taper.  Start permetherin from neck down at bedtime and wash off in the morning, repeat in 1 week.  Start ivermectin 4 by mouth today, repeat in 2 weeks  2 Week Prednisone Taper  You will be given a prescription for 100 tablets of oral Prednisone. It is very important that you take this according to the exact schedule provided below. This type of regimen for taking medication is often called a "taper", because your dosage will steadily decrease over a two week period until it is discontinued altogether.  ALWAYS take this medicine with food to prevent it from irritating your stomach. You should also take your Prednisone during morning hours.  Call the clinic at [phone] if you gain more than two pounds in one day, notice swelling anywhere on your body, have shortness of breath, black or red bowel movements, brown or red vomitus, desire to drink large amounts of fluids, a fever, or extreme weakness.   Oral Prednisone over Two Weeks  Day  Week 1  Week '2   1  12 '$ tablets  7 tablets   2  12 tablets  6 tablets   3  11 tablets  5 tablets   4  10 tablets  4 tablets   5  10 tablets  3 tablets   6  9 tablets  2 tablets   7  8 tablets  1 tablet   Risks of prednisone taper discussed including mood irritability, insomnia, weight gain, stomach ulcers, increased risk of infection, increased blood sugar (diabetes), hypertension, osteoporosis with long-term or frequent use, and rare risk of avascular necrosis of the hip.   If you have any questions or concerns for your doctor, please call our main line at [phone] and press option 4 to reach your doctor's medical assistant. If no one answers, please leave a voicemail as directed and we will return your call as soon as possible. Messages left after 4 pm will be answered the following business day.   You may also send [place] a message via [place]. We typically respond to MyChart messages within 1-2 business days.  For  prescription refills, please ask your pharmacy to contact our office. Our fax number is [phone].  If you have an urgent issue when the clinic is closed that cannot wait until the next business day, you can page your doctor at the number below.    Please note that while we do our best to be available for urgent issues outside of office hours, we are not available 24/7.   If you have an urgent issue and are unable to reach [place], you may choose to seek medical care at your doctor's office, retail clinic, urgent care center, or emergency room.  If you have a medical emergency, please immediately call 911 or go to the emergency department.  Pager Numbers  - Dr. Nehemiah Massed: [phone]  - Dr. Laurence Ferrari: [phone]  - Dr. Nicole Kindred: [phone]  In the event of inclement weather, please call our main line at [phone] for an update on the status of any delays or closures.  Dermatology Medication Tips: Please keep the boxes that topical medications come in in order to help keep track of the instructions about where and how to use these. Pharmacies typically print the medication instructions only on the boxes and not directly on the medication tubes.   If your medication is too expensive, please contact our office at [phone] option 4 or send [place] a message through [place].  We are unable to tell what your co-pay for medications will be in advance as this is different depending on your insurance coverage. However, we may be able to find a substitute medication at lower cost or fill out paperwork to get insurance to cover a needed medication.   If a prior authorization is required to get your medication covered by your insurance company, please allow [place] 1-2 business days to complete this process.  Drug prices often vary depending on where the prescription is filled and some pharmacies may offer cheaper prices.  The website www.goodrx.com contains coupons for medications through different  pharmacies. The prices here do not account for what the cost may be with help from insurance (it may be cheaper with your insurance), but the website can give you the price if you did not use any insurance.  - You can print the associated coupon and take it with your prescription to the pharmacy.  - You may also stop by our office during regular business hours and pick up a GoodRx coupon card.  - If you need your prescription sent electronically to a different pharmacy, notify our office through [facility] or by phone at [phone] option 4.

## 2021-07-11 NOTE — Progress Notes (Signed)
   Follow-Up Visit   Subjective  Nancy Kirk is a 56 y.o. female who presents for the following: Rash (Patient here today for itchy rash all over, started the weekend of 4th of July. Patient went to Urgent Care about 1 month ago and was given permethrin that she used and retreated a week later. Patient advises she washed everything in hot water, vacuumed and was feeling better for one day. Patient lives with her mother and had scabies back in 2016. Patient's mother did not treat last month along with patient. ).  No new medications, recent illnesses. Patient is using Dove soap and free detergents.   The following portions of the chart were reviewed this encounter and updated as appropriate:   Tobacco  Allergies  Meds  Problems  Med Hx  Surg Hx  Fam Hx      Review of Systems:  No other skin or systemic complaints except as noted in HPI or Assessment and Plan.  Objective  Well appearing patient in no apparent distress; mood and affect are within normal limits.  A focused examination was performed including trunk, extremities. Relevant physical exam findings are noted in the Assessment and Plan.  trunk, extremities Scaly erythematous patches, widespread   Assessment & Plan  Rash trunk, extremities  Possible hypersensitivity reaction to scabies or other  Patient with severe pruritus Start permetherin from neck down at bedtime and wash off in the morning, repeat in 1 week.  Start ivermectin 4 by mouth today, repeat in 2 weeks  Start prednisone 2 week taper.   Risks of prednisone taper discussed including mood irritability, insomnia, weight gain, stomach ulcers, increased risk of infection, increased blood sugar (diabetes), hypertension, osteoporosis with long-term or frequent use, and rare risk of avascular necrosis of the hip.   Patient's mother - Corliss Blacker  06/21/1941 Permethrin called in to [facility] for patient's mother  Discussed highly contagious condition.   All household members/close contacts should be treated at same time.  After treatment with topical Elimite cream applied as directed overnight (and/or oral ivermectin), wash all bed sheets, towels, and recently worn clothing in hot water, and dry on high heat.  Repeat entire process in one week.  If rash recurs or not clearing, will biopsy at follow-up   predniSONE (DELTASONE) 5 MG tablet - trunk, extremities Directions given to patient for 2 week taper.  ivermectin (STROMECTOL) 3 MG TABS tablet - trunk, extremities Take 3 1/2 tablets today, 3 1/2 tablets in 2 weeks.  permethrin (ELIMITE) 5 % cream - trunk, extremities Apply 1 application topically once for 1 dose. Repeat in 1 week  Return in about 3 weeks (around 08/01/2021) for Rash.  Graciella Belton, RMA, am acting as scribe for Forest Gleason, MD .  Documentation: I have reviewed the above documentation for accuracy and completeness, and I agree with the above.  Forest Gleason, MD

## 2021-07-14 ENCOUNTER — Encounter: Payer: Self-pay | Admitting: Dermatology

## 2021-07-20 ENCOUNTER — Encounter: Payer: Self-pay | Admitting: Dermatology

## 2021-07-24 ENCOUNTER — Telehealth: Payer: Self-pay

## 2021-07-24 NOTE — Telephone Encounter (Signed)
Please give ICD10 B86 Scabies thank you!

## 2021-07-24 NOTE — Telephone Encounter (Signed)
[facility] denied my PA for patient's ivermectin stating the rash diagnosis and ICD10 code were not "medically accepted indication".

## 2021-07-25 NOTE — Telephone Encounter (Signed)
Prior Authorization approved 

## 2021-07-25 NOTE — Telephone Encounter (Signed)
Prior Authorization re sent to [place].

## 2021-07-31 ENCOUNTER — Other Ambulatory Visit: Payer: Self-pay

## 2021-07-31 ENCOUNTER — Ambulatory Visit: Payer: Managed Care, Other (non HMO) | Admitting: Dermatology

## 2021-07-31 DIAGNOSIS — R21 Rash and other nonspecific skin eruption: Secondary | ICD-10-CM

## 2021-07-31 DIAGNOSIS — L299 Pruritus, unspecified: Secondary | ICD-10-CM

## 2021-07-31 MED ORDER — CLOBETASOL PROPIONATE 0.05 % EX SOLN: 1.0000 "application " | Freq: Two times a day (BID) | CUTANEOUS | 1 refills | Status: DC

## 2021-07-31 MED ORDER — DUPILUMAB 300 MG/2ML ~~LOC~~ SOSY
300.0000 mg | PREFILLED_SYRINGE | Freq: Once | SUBCUTANEOUS | Status: AC
Start: 2021-07-31 — End: 2021-07-31
  Administered 2021-07-31: 300 mg via SUBCUTANEOUS

## 2021-07-31 MED ORDER — HYDROXYZINE HCL 25 MG PO TABS: ORAL_TABLET | ORAL | 1 refills | Status: DC

## 2021-07-31 NOTE — Patient Instructions (Signed)
Discontinue sulfur wash.   Eczema Skin Care  Buy TWO 16oz jars of CeraVe moisturizing cream  [facility], [facility], [facility] (no prescription needed)  Costs about $15 per jar   Jar #1: Use as a moisturizer as needed. Can be applied to any area of the body. Use twice daily to unaffected areas. Jar #2: Pour one 69ml bottle of clobetasol 0.05% solution into jar, mix well. Label this jar to indicate the medication has been added. Use twice daily to affected areas. Do not apply to face, groin or underarms.  Moisturizer may burn or sting initially. Try for at least 4 weeks.    Topical steroids (such as triamcinolone, fluocinolone, fluocinonide, mometasone, clobetasol, halobetasol, betamethasone, hydrocortisone) can cause thinning and lightening of the skin if they are used for too long in the same area. Your physician has selected the right strength medicine for your problem and area affected on the body. Please use your medication only as directed by your physician to prevent side effects.   Dupilumab (Dupixent) is a treatment given by injection for adults and children with moderate-to-severe atopic dermatitis. Goal is control of skin condition, not cure. It is given as 2 injections at the first dose followed by 1 injection ever 2 weeks thereafter.  Young children are dosed monthly.  Potential side effects include allergic reaction, herpes infections, injection site reactions and conjunctivitis (inflammation of the eyes).  The use of Dupixent requires long term medication management, including periodic office visits.  If you have any questions or concerns for your doctor, please call our main line at [phone] and press option 4 to reach your doctor's medical assistant. If no one answers, please leave a voicemail as directed and we will return your call as soon as possible. Messages left after 4 pm will be answered the following business day.   You may also send [place] a message via [place]. We typically  respond to MyChart messages within 1-2 business days.  For prescription refills, please ask your pharmacy to contact our office. Our fax number is [phone].  If you have an urgent issue when the clinic is closed that cannot wait until the next business day, you can page your doctor at the number below.    Please note that while we do our best to be available for urgent issues outside of office hours, we are not available 24/7.   If you have an urgent issue and are unable to reach [place], you may choose to seek medical care at your doctor's office, retail clinic, urgent care center, or emergency room.  If you have a medical emergency, please immediately call 911 or go to the emergency department.  Pager Numbers  - Dr. Nehemiah Massed: [phone]  - Dr. Laurence Ferrari: [phone]  - Dr. Nicole Kindred: [phone]  In the event of inclement weather, please call our main line at [phone] for an update on the status of any delays or closures.  Dermatology Medication Tips: Please keep the boxes that topical medications come in in order to help keep track of the instructions about where and how to use these. Pharmacies typically print the medication instructions only on the boxes and not directly on the medication tubes.   If your medication is too expensive, please contact our office at [phone] option 4 or send [place] a message through [place].   We are unable to tell what your co-pay for medications will be in advance as this is different depending on your insurance coverage. However, we may be able to  find a substitute medication at lower cost or fill out paperwork to get insurance to cover a needed medication.   If a prior authorization is required to get your medication covered by your insurance company, please allow [place] 1-2 business days to complete this process.  Drug prices often vary depending on where the prescription is filled and some pharmacies may offer cheaper prices.  The website  www.goodrx.com contains coupons for medications through different pharmacies. The prices here do not account for what the cost may be with help from insurance (it may be cheaper with your insurance), but the website can give you the price if you did not use any insurance.  - You can print the associated coupon and take it with your prescription to the pharmacy.  - You may also stop by our office during regular business hours and pick up a GoodRx coupon card.  - If you need your prescription sent electronically to a different pharmacy, notify our office through [facility] or by phone at [phone] option 4.

## 2021-07-31 NOTE — Progress Notes (Signed)
Follow-Up Visit   Subjective  Nancy Kirk is a 55 y.o. female who presents for the following: Follow-up (Patient here today for 3 week rash follow up. Patient did permethrin topically 1 week apart as well as ivermectin 4 by mouth, repeated in 2 weeks and a prednisone 2 week taper. Patient still with a lot of itching and she feels like it is getting worse. ).  Patient also using sulfur to wash and neem oil. Patient advises she can feel the bites.   No supplements, no herbal teas and no change in soaps or detergents per patient.   The following portions of the chart were reviewed this encounter and updated as appropriate:   Tobacco  Allergies  Meds  Problems  Med Hx  Surg Hx  Fam Hx      Review of Systems:  No other skin or systemic complaints except as noted in HPI or Assessment and Plan.  Objective  Well appearing patient in no apparent distress; mood and affect are within normal limits.  A focused examination was performed including face, chest, arms, abdomen, back. Relevant physical exam findings are noted in the Assessment and Plan.  Right Abdomen Widespread pink plaques with scattered excoriations, rare excoriated papule coaslescing to plaque Ddx hypersensitivity vs atopic derm vs alpha gal vs ACD vs eczematous BP            Assessment & Plan  Itch trunk, face, extremities  Discontinue sulfur wash.   Start hydroxyzine 25mg  1-2 every 6 hours as needed for itch. May cause drowsiness.   Pour one 107ml bottle of clobetasol 0.05% solution into jar, mix well. Label this jar to indicate the medication has been added. Use twice daily to affected areas. Do not apply to face, groin or underarms.  Topical steroids (such as triamcinolone, fluocinolone, fluocinonide, mometasone, clobetasol, halobetasol, betamethasone, hydrocortisone) can cause thinning and lightening of the skin if they are used for too long in the same area. Your physician has selected the right  strength medicine for your problem and area affected on the body. Please use your medication only as directed by your physician to prevent side effects.   Start Dupixent 300mg /7mL samples today. Pending lab results, will send rx in.  Dupixent 300mg /47mL injected bilateral lower abdomen today. Patient tolerated well.    hydrOXYzine (ATARAX/VISTARIL) 25 MG tablet - trunk, face, extremities 1-2 tabs every 6 hours as needed for itch. May cause drowsiness.  Related Medications dupilumab (DUPIXENT) prefilled syringe 300 mg   Rash Right Abdomen  Pour one 27ml bottle of clobetasol 0.05% solution into jar, mix well. Label this jar to indicate the medication has been added. Use twice daily to affected areas. Do not apply to face, groin or underarms.  Perilesional DIF from right abdomen    Skin / nail biopsy - Right Abdomen Type of biopsy: punch   Informed consent: discussed and consent obtained   Timeout: patient name, date of birth, surgical site, and procedure verified   Patient was prepped and draped in usual sterile fashion: Area prepped with isopropyl alcohol. Anesthesia: the lesion was anesthetized in a standard fashion   Anesthetic:  1% lidocaine w/ epinephrine 1-100,000 buffered w/ 8.4% NaHCO3 Punch size:  4 mm Suture size:  4-0 Suture type: Prolene (polypropylene)   Suture removal (days):  14 Hemostasis achieved with: suture and aluminum chloride   Outcome: patient tolerated procedure well   Post-procedure details: wound care instructions given   Additional details:  Mupirocin and a dressing  applied  Skin / nail biopsy - Right Abdomen Type of biopsy: punch   Informed consent: discussed and consent obtained   Timeout: patient name, date of birth, surgical site, and procedure verified   Patient was prepped and draped in usual sterile fashion: Area prepped with isopropyl alcohol. Anesthesia: the lesion was anesthetized in a standard fashion   Anesthetic:  1% lidocaine w/  epinephrine 1-100,000 buffered w/ 8.4% NaHCO3 Punch size:  4 mm Suture size:  4-0 Suture removal (days):  14 Hemostasis achieved with: suture and aluminum chloride   Outcome: patient tolerated procedure well   Post-procedure details: wound care instructions given   Additional details:  Mupirocin and a dressing applied  clobetasol (TEMOVATE) 0.05 % external solution - Right Abdomen Apply 1 application topically 2 (two) times daily. Patient will pour one 19ml bottle of clobetasol 0.05% solution into jar, mix well. Label this jar to indicate the medication has been added. Use twice daily to affected areas. Do not apply to face, groin or underarms.  Related Procedures Anatomic Pathology Report  Return in about 2 weeks (around 08/14/2021) for Suture Removal.  Graciella Belton, RMA, am acting as scribe for Forest Gleason, MD .  Documentation: I have reviewed the above documentation for accuracy and completeness, and I agree with the above.  Forest Gleason, MD

## 2021-08-06 ENCOUNTER — Encounter: Payer: Self-pay | Admitting: Dermatology

## 2021-08-14 ENCOUNTER — Ambulatory Visit: Payer: Managed Care, Other (non HMO) | Admitting: Dermatology

## 2021-08-14 ENCOUNTER — Other Ambulatory Visit: Payer: Self-pay

## 2021-08-14 DIAGNOSIS — L308 Other specified dermatitis: Secondary | ICD-10-CM

## 2021-08-14 MED ORDER — DUPIXENT 300 MG/2ML ~~LOC~~ SOAJ: 600.0000 mg | Freq: Once | SUBCUTANEOUS | 0 refills | Status: DC

## 2021-08-14 MED ORDER — DUPIXENT 300 MG/2ML ~~LOC~~ SOAJ: 300.0000 mg | SUBCUTANEOUS | 5 refills | Status: DC

## 2021-08-14 NOTE — Progress Notes (Signed)
Follow-Up Visit   Subjective  Nancy Kirk is a 55 y.o. female who presents for the following: Follow-up (Patient here today for suture removal and follow up. Patient feels that the Nancy Kirk samples given at last visit have helped but she is still itching. Patient also taking hydroxyzine as needed for itch. Did not have resolution on prednisone).  Patient advises on a scale from 1-10, itching is a 10.  The following portions of the chart were reviewed this encounter and updated as appropriate:   Tobacco  Allergies  Meds  Problems  Med Hx  Surg Hx  Fam Hx      Review of Systems:  No other skin or systemic complaints except as noted in HPI or Assessment and Plan.  Objective  Well appearing patient in no apparent distress; mood and affect are within normal limits.  A focused examination was performed including face, neck, chest and back. Relevant physical exam findings are noted in the Assessment and Plan.  trunk, extremities Widespread scaly pink plaque with excoriations   Assessment & Plan  Other eczema trunk, extremities  Will submit for Nancy Kirk.   Sample of Nancy Kirk given to patient today to take once daily for 2 weeks. Labs ordered today (CBC with diff, CMP, Quantiferon gold) and will stop Nancy Kirk if any concerns with baseline labs. Deferred lipid panel today as we do not anticipate long-term use of Nancy Kirk at this time. Patient will let us know if not doing better in 1 week and we will increase to 15 Kirk twice daily.  Lot # [id number]   Exp: 01/11/2023  Nancy Kirk (Nancy Kirk) is a treatment given by injection for adults and Kirk with moderate-to-severe atopic dermatitis. Goal is control of skin condition, not cure. It is given as 2 injections at the first dose followed by 1 injection ever 2 weeks thereafter.  Nancy Kirk are dosed monthly.  Potential side effects include allergic reaction, herpes infections, injection site reactions and conjunctivitis (inflammation  of the eyes).  The use of Nancy Kirk requires long term medication management, including periodic office visits.  Atopic dermatitis (eczema) is a chronic, relapsing, pruritic condition that can significantly affect quality of life. It is often associated with allergic rhinitis and/or asthma and can require treatment with topical medications, phototherapy, or in severe cases a biologic medication called Nancy Kirk in Kirk and adults.   Chronic condition with duration or expected duration over one year. Condition is bothersome to patient. Currently flared.    Nancy Kirk (Nancy Kirk) 300 Kirk/2ML SOPN - trunk, extremities Inject 600 Kirk into the skin once for 1 dose. On day 1.  Nancy Kirk (Nancy Kirk) 300 Kirk/2ML SOPN - trunk, extremities Inject 300 Kirk into the skin every 14 (fourteen) days. Starting at day 15 for maintenance.  Related Procedures CMP CBC with Differential/Platelets QuantiFERON-TB Gold Plus  Encounter for Removal of Sutures - Incision site at the right abdomen is clean, dry and intact - Wound cleansed, sutures removed, wound cleansed and steri strips applied.  - Discussed pathology results showing spongiotic dermatitis  - Patient advised to keep steri-strips dry until they fall off. - Scars remodel for a full year. - Once steri-strips fall off, patient can apply over-the-counter silicone scar cream each night to help with scar remodeling if desired. - Patient advised to call with any concerns or if they notice any new or changing lesions.  Return in about 2 weeks (around 08/28/2021) for Nancy Kirk, 4 weeks with Dr. Laurence Ferrari.  Graciella Belton, RMA, am acting as scribe  for Forest Gleason, MD .  Documentation: I have reviewed the above documentation for accuracy and completeness, and I agree with the above.  Forest Gleason, MD

## 2021-08-18 ENCOUNTER — Encounter: Payer: Self-pay | Admitting: Dermatology

## 2021-08-18 ENCOUNTER — Telehealth: Payer: Self-pay | Admitting: Dermatology

## 2021-08-18 NOTE — Telephone Encounter (Signed)
Spoke with [facility]. Results not posted in Epic yet but CBC and CMP reported Per [facility],   Everything on CBC and CMP wnl except RBC slightly elevated at 5.40, Hematocrit elevated at 48.4, BUN/Cr ratio low at 7  Quant gold not resulted yet.  So far nothing that would make me concerned about the Rinvoq. Messaged patient to check-in. If she is doing well, will decrease Rinvoq to every other day until we have the final results. If still significantly symptomatic, ok to continue Rinvoq daily for another few days while we wait for results (as a better alternative to prolonged higher dose prednisone).

## 2021-08-20 ENCOUNTER — Telehealth: Payer: Self-pay | Admitting: Dermatology

## 2021-08-20 LAB — CBC WITH DIFFERENTIAL/PLATELET
Basophils Absolute: 0.1 10*3/uL (ref 0.0–0.2)
Basos: 1 %
EOS (ABSOLUTE): 0 10*3/uL (ref 0.0–0.4)
Eos: 1 %
Hematocrit: 48.4 % — ABNORMAL HIGH (ref 34.0–46.6)
Hemoglobin: 15.7 g/dL (ref 11.1–15.9)
Immature Grans (Abs): 0 10*3/uL (ref 0.0–0.1)
Immature Granulocytes: 1 %
Lymphocytes Absolute: 1.6 10*3/uL (ref 0.7–3.1)
Lymphs: 27 %
MCH: 29.1 pg (ref 26.6–33.0)
MCHC: 32.4 g/dL (ref 31.5–35.7)
MCV: 90 fL (ref 79–97)
Monocytes Absolute: 0.5 10*3/uL (ref 0.1–0.9)
Monocytes: 8 %
Neutrophils Absolute: 3.9 10*3/uL (ref 1.4–7.0)
Neutrophils: 62 %
Platelets: 328 10*3/uL (ref 150–450)
RBC: 5.4 x10E6/uL — ABNORMAL HIGH (ref 3.77–5.28)
RDW: 13 % (ref 11.7–15.4)
WBC: 6.1 10*3/uL (ref 3.4–10.8)

## 2021-08-20 LAB — COMPREHENSIVE METABOLIC PANEL
ALT: 32 IU/L (ref 0–32)
AST: 21 IU/L (ref 0–40)
Albumin/Globulin Ratio: 2 (ref 1.2–2.2)
Albumin: 4.8 g/dL (ref 3.8–4.9)
Alkaline Phosphatase: 64 IU/L (ref 44–121)
BUN/Creatinine Ratio: 7 — ABNORMAL LOW (ref 9–23)
BUN: 6 mg/dL (ref 6–24)
Bilirubin Total: 0.4 mg/dL (ref 0.0–1.2)
CO2: 24 mmol/L (ref 20–29)
Calcium: 9.3 mg/dL (ref 8.7–10.2)
Chloride: 103 mmol/L (ref 96–106)
Creatinine, Ser: 0.88 mg/dL (ref 0.57–1.00)
Globulin, Total: 2.4 g/dL (ref 1.5–4.5)
Glucose: 83 mg/dL (ref 70–99)
Potassium: 4.4 mmol/L (ref 3.5–5.2)
Sodium: 143 mmol/L (ref 134–144)
Total Protein: 7.2 g/dL (ref 6.0–8.5)
eGFR: 77 mL/min/{1.73_m2} (ref >59)

## 2021-08-20 LAB — QUANTIFERON-TB GOLD PLUS
QuantiFERON Mitogen Value: >10 IU/mL
QuantiFERON Nil Value: 0.01 IU/mL
QuantiFERON TB1 Ag Value: 0.02 IU/mL
QuantiFERON TB2 Ag Value: 0.02 IU/mL
QuantiFERON-TB Gold Plus: NEGATIVE

## 2021-08-20 NOTE — Telephone Encounter (Signed)
All 3 results are now in chart.

## 2021-08-20 NOTE — Telephone Encounter (Signed)
Can you please call [facility] and request results from labs last week? They should be showing in our system by now. I called over the weekend and at least the CBC and CMP were resulted, though I think Quant gold should be back by now too. Thank you!

## 2021-08-20 NOTE — Telephone Encounter (Signed)
Ok thank you. Did they say they would send over the other results? They were supposed to fax the others over the weekend. Thank you!

## 2021-08-26 ENCOUNTER — Other Ambulatory Visit: Payer: Self-pay

## 2021-08-26 ENCOUNTER — Encounter: Payer: Self-pay | Admitting: Dermatology

## 2021-08-26 DIAGNOSIS — L308 Other specified dermatitis: Secondary | ICD-10-CM

## 2021-08-26 MED ORDER — DUPIXENT 300 MG/2ML ~~LOC~~ SOAJ: 300.0000 mg | SUBCUTANEOUS | 5 refills | Status: DC

## 2021-08-26 MED ORDER — DUPIXENT 300 MG/2ML ~~LOC~~ SOAJ: 600.0000 mg | Freq: Once | SUBCUTANEOUS | 0 refills | Status: AC

## 2021-08-26 NOTE — Progress Notes (Signed)
Per Iantha Fallen rx needs to be filled with [facility].

## 2021-08-28 ENCOUNTER — Other Ambulatory Visit: Payer: Self-pay

## 2021-08-28 ENCOUNTER — Ambulatory Visit: Payer: Managed Care, Other (non HMO)

## 2021-08-28 DIAGNOSIS — L308 Other specified dermatitis: Secondary | ICD-10-CM | POA: Diagnosis not present

## 2021-08-28 MED ORDER — DUPILUMAB 300 MG/2ML ~~LOC~~ SOSY
300.0000 mg | PREFILLED_SYRINGE | Freq: Once | SUBCUTANEOUS | Status: AC
  Administered 2021-08-28: 300 mg via SUBCUTANEOUS

## 2021-08-28 NOTE — Progress Notes (Signed)
Patient here for Dupixent injection.   Dupixent 300mg /20mL injected SQ into right upper arm. Patient tolerated well.   LOT: [id number] EXP: 11/2023

## 2021-09-24 ENCOUNTER — Ambulatory Visit: Payer: Managed Care, Other (non HMO) | Admitting: Dermatology

## 2021-10-31 ENCOUNTER — Other Ambulatory Visit: Payer: Self-pay | Admitting: Internal Medicine

## 2021-10-31 DIAGNOSIS — Z1231 Encounter for screening mammogram for malignant neoplasm of breast: Secondary | ICD-10-CM

## 2022-04-17 ENCOUNTER — Ambulatory Visit: Payer: Managed Care, Other (non HMO) | Admitting: Dermatology

## 2022-05-28 ENCOUNTER — Ambulatory Visit (INDEPENDENT_AMBULATORY_CARE_PROVIDER_SITE_OTHER): Payer: BC Managed Care – PPO | Admitting: Dermatology

## 2022-05-28 DIAGNOSIS — L219 Seborrheic dermatitis, unspecified: Secondary | ICD-10-CM

## 2022-05-28 DIAGNOSIS — Z1283 Encounter for screening for malignant neoplasm of skin: Secondary | ICD-10-CM | POA: Diagnosis not present

## 2022-05-28 DIAGNOSIS — D229 Melanocytic nevi, unspecified: Secondary | ICD-10-CM

## 2022-05-28 DIAGNOSIS — D2371 Other benign neoplasm of skin of right lower limb, including hip: Secondary | ICD-10-CM

## 2022-05-28 DIAGNOSIS — L814 Other melanin hyperpigmentation: Secondary | ICD-10-CM

## 2022-05-28 DIAGNOSIS — D1800 Hemangioma unspecified site: Secondary | ICD-10-CM

## 2022-05-28 DIAGNOSIS — D2372 Other benign neoplasm of skin of left lower limb, including hip: Secondary | ICD-10-CM

## 2022-05-28 DIAGNOSIS — L570 Actinic keratosis: Secondary | ICD-10-CM

## 2022-05-28 DIAGNOSIS — L578 Other skin changes due to chronic exposure to nonionizing radiation: Secondary | ICD-10-CM

## 2022-05-28 DIAGNOSIS — L603 Nail dystrophy: Secondary | ICD-10-CM | POA: Diagnosis not present

## 2022-05-28 DIAGNOSIS — D2272 Melanocytic nevi of left lower limb, including hip: Secondary | ICD-10-CM

## 2022-05-28 DIAGNOSIS — L821 Other seborrheic keratosis: Secondary | ICD-10-CM

## 2022-05-28 DIAGNOSIS — D225 Melanocytic nevi of trunk: Secondary | ICD-10-CM | POA: Diagnosis not present

## 2022-05-28 MED ORDER — CLOBETASOL PROPIONATE 0.05 % EX SOLN: CUTANEOUS | 6 refills | Status: DC

## 2022-05-28 MED ORDER — CICLOPIROX OLAMINE 0.77 % EX SUSP: 1.0000 "application " | Freq: Every day | CUTANEOUS | 11 refills | Status: DC

## 2022-05-28 NOTE — Progress Notes (Signed)
Follow-Up Visit   Subjective  Nancy Kirk is a 56 y.o. female who presents for the following: Annual Exam (Patient here tbse, would like to have left great toenail having some trouble with thickeness and deformed. ).  The patient presents for Total-Body Skin Exam (TBSE) for skin cancer screening and mole check.  The patient has spots, moles and lesions to be evaluated, some may be new or changing and the patient has concerns that these could be cancer.  The following portions of the chart were reviewed this encounter and updated as appropriate:  Tobacco  Allergies  Meds  Problems  Med Hx  Surg Hx  Fam Hx      Review of Systems: No other skin or systemic complaints except as noted in HPI or Assessment and Plan.   Objective  Well appearing patient in no apparent distress; mood and affect are within normal limits.  A full examination was performed including scalp, head, eyes, ears, nose, lips, neck, chest, axillae, abdomen, back, buttocks, bilateral upper extremities, bilateral lower extremities, hands, feet, fingers, toes, fingernails, and toenails. All findings within normal limits unless otherwise noted below.   Assessment & Plan  Seborrheic dermatitis Scalp  Chronic condition with duration or expected duration over one year. Currently well-controlled.  Seborrheic Dermatitis  -  is a chronic persistent rash characterized by pinkness and scaling most commonly of the mid face but also can occur on the scalp (dandruff), ears; mid chest, mid back and groin.  It tends to be exacerbated by stress and cooler weather.  People who have neurologic disease may experience new onset or exacerbation of existing seborrheic dermatitis.  The condition is not curable but treatable and can be controlled.  Continue prn Ciclopirox suspension 60 ml, apply daily to once weekly as needed to keep condition under control Continue prn clobetasol 0.05 % solution daily prn (pt declined foam). Avoid  applying to face, groin, and axilla. Use as directed. Risk of skin atrophy with long-term use reviewed.   Topical steroids (such as triamcinolone, fluocinolone, fluocinonide, mometasone, clobetasol, halobetasol, betamethasone, hydrocortisone) can cause thinning and lightening of the skin if they are used for too long in the same area. Your physician has selected the right strength medicine for your problem and area affected on the body. Please use your medication only as directed by your physician to prevent side effects.    Related Medications ketoconazole (NIZORAL) 2 % shampoo Apply 1 application topically 3 (three) times a week.  ciclopirox (LOPROX) 0.77 % SUSP Apply 1 application  topically daily. And as needed weekly  clobetasol (TEMOVATE) 0.05 % external solution APPLY TOPICALLY TO THE AFFECTED AREA DAILY AS NEEDED FOR ITCHING. AVOID FACE, GROIN, UNDERARMS  Nail dystrophy left great toenail  Stop with kerasal   Not enough toenail to sample today to r/o fungus   Give 2 months of growth and can re-evaluate and consider clipping  Start Ciclopirox solution - can use a few drops to aa qd      Actinic keratosis right lateral knee x 1  Ak consder bx if not resolved   Actinic keratoses are precancerous spots that appear secondary to cumulative UV radiation exposure/sun exposure over time. They are chronic with expected duration over 1 year. A portion of actinic keratoses will progress to squamous cell carcinoma of the skin. It is not possible to reliably predict which spots will progress to skin cancer and so treatment is recommended to prevent development of skin cancer.  Recommend daily  broad spectrum sunscreen SPF 30+ to sun-exposed areas, reapply every 2 hours as needed.  Recommend staying in the shade or wearing long sleeves, sun glasses (UVA+UVB protection) and wide brim hats (4-inch brim around the entire circumference of the hat). Call for new or changing lesions.  Prior  to procedure, discussed risks of blister formation, small wound, skin dyspigmentation, or rare scar following cryotherapy. Recommend Vaseline ointment to treated areas while healing.   Destruction of lesion - right lateral knee x 1  Destruction method: cryotherapy   Informed consent: discussed and consent obtained   Lesion destroyed using liquid nitrogen: Yes   Cryotherapy cycles:  2 Outcome: patient tolerated procedure well with no complications   Post-procedure details: wound care instructions given   Additional details:  Prior to procedure, discussed risks of blister formation, small wound, skin dyspigmentation, or rare scar following cryotherapy. Recommend Vaseline ointment to treated areas while healing.   Nevus (2) right upper abdomen; left plantar foot  Benign-appearing.  Observation.  Call clinic for new or changing lesions.  Recommend daily use of broad spectrum spf 30+ sunscreen to sun-exposed areas.   without features suspicious for malignancy on dermoscopy for mole at right upper abdomen    Lentigines - Scattered tan macules - Due to sun exposure - Benign-appearing, observe - Recommend daily broad spectrum sunscreen SPF 30+ to sun-exposed areas, reapply every 2 hours as needed. - Call for any changes  Dermatofibroma - Firm pink/brown papulenodule with dimple sign at left lateral pretibia, right calf, and left lateral thigh  - Benign appearing - Call for any changes  Seborrheic Keratoses - Stuck-on, waxy, tan-brown papules and/or plaques  - Benign-appearing - Discussed benign etiology and prognosis. - Observe - Call for any changes  Melanocytic Nevi - Tan-brown and/or pink-flesh-colored symmetric macules and papules - Benign appearing on exam today - Observation - Call clinic for new or changing moles - Recommend daily use of broad spectrum spf 30+ sunscreen to sun-exposed areas.   Hemangiomas - Red papules - Discussed benign nature - Observe - Call for  any changes  Actinic Damage - Chronic condition, secondary to cumulative UV/sun exposure - diffuse scaly erythematous macules with underlying dyspigmentation - Recommend daily broad spectrum sunscreen SPF 30+ to sun-exposed areas, reapply every 2 hours as needed.  - Staying in the shade or wearing long sleeves, sun glasses (UVA+UVB protection) and wide brim hats (4-inch brim around the entire circumference of the hat) are also recommended for sun protection.  - Call for new or changing lesions.  Skin cancer screening performed today. Return for 3 - 4 month recheck ak, 1 year tbse . I, Ruthell Rummage, CMA, am acting as scribe for Forest Gleason, MD.  Documentation: I have reviewed the above documentation for accuracy and completeness, and I agree with the above.  Forest Gleason, MD

## 2022-05-28 NOTE — Patient Instructions (Addendum)
Recommend Dr. Tobie Poet at [facility] in [place] for [place] taking Heliocare sun protection supplement daily in sunny weather for additional sun protection. For maximum protection on the sunniest days, you can take up to 2 capsules of regular Heliocare OR take 1 capsule of Heliocare Ultra. For prolonged exposure (such as a full day in the sun), you can repeat your dose of the supplement 4 hours after your first dose. Heliocare can be purchased at [employer], at some [facility] or at [email or web address].     Melanoma ABCDEs  Melanoma is the most dangerous type of skin cancer, and is the leading cause of death from skin disease.  You are more likely to develop melanoma if you: Have light-colored skin, light-colored eyes, or red or blond hair Spend a lot of time in the sun Tan regularly, either outdoors or in a tanning bed Have had blistering sunburns, especially during childhood Have a close family member who has had a melanoma Have atypical moles or large birthmarks  Early detection of melanoma is key since treatment is typically straightforward and cure rates are extremely high if we catch it early.   The first sign of melanoma is often a change in a mole or a new dark spot.  The ABCDE system is a way of remembering the signs of melanoma.  A for asymmetry:  The two halves do not match. B for border:  The edges of the growth are irregular. C for color:  A mixture of colors are present instead of an even brown color. D for diameter:  Melanomas are usually (but not always) greater than 33m - the size of a pencil eraser. E for evolution:  The spot keeps changing in size, shape, and color.  Please check your skin once per month between visits. You can use a small mirror in front and a large mirror behind you to keep an eye on the back side or your body.   If you see any new or changing lesions before your next follow-up, please call to schedule a  visit.  Please continue daily skin protection including broad spectrum sunscreen SPF 30+ to sun-exposed areas, reapplying every 2 hours as needed when you're outdoors.   Staying in the shade or wearing long sleeves, sun glasses (UVA+UVB protection) and wide brim hats (4-inch brim around the entire circumference of the hat) are also recommended for sun protection.    Due to recent changes in healthcare laws, you may see results of your pathology and/or laboratory studies on MyChart before the doctors have had a chance to review them. We understand that in some cases there may be results that are confusing or concerning to you. Please understand that not all results are received at the same time and often the doctors may need to interpret multiple results in order to provide you with the best plan of care or course of treatment. Therefore, we ask that you please give u[place]2 business days to thoroughly review all your results before contacting the office for clarification. Should we see a critical lab result, you will be contacted sooner.   If You Need Anything After Your Visit  If you have any questions or concerns for your doctor, please call our main line at [phone] press option 4 to reach your doctor's medical assistant. If no one answers, please leave a voicemail as directed and we will return your call as soon as possible. Messages left after  4 pm will be answered the following business day.   You may also send [place] a message via [place]. We typically respond to MyChart messages within 1-2 business days.  For prescription refills, please ask your pharmacy to contact our office. Our fax number is [phone].  If you have an urgent issue when the clinic is closed that cannot wait until the next business day, you can page your doctor at the number below.    Please note that while we do our best to be available for urgent issues outside of office hours, we are not available 24/7.   If you  have an urgent issue and are unable to reach [place], you may choose to seek medical care at your doctor's office, retail clinic, urgent care center, or emergency room.  If you have a medical emergency, please immediately call 911 or go to the emergency department.  Pager Numbers  - Dr. Nehemiah Massed: [phone]  - Dr. Laurence Ferrari: [phone]  - Dr. Nicole Kindred: [phone]  In the event of inclement weather, please call our main line at [phone] for an update on the status of any delays or closures.  Dermatology Medication Tips: Please keep the boxes that topical medications come in in order to help keep track of the instructions about where and how to use these. Pharmacies typically print the medication instructions only on the boxes and not directly on the medication tubes.   If your medication is too expensive, please contact our office at [phone] option 4 or send [place] a message through [place].   We are unable to tell what your co-pay for medications will be in advance as this is different depending on your insurance coverage. However, we may be able to find a substitute medication at lower cost or fill out paperwork to get insurance to cover a needed medication.   If a prior authorization is required to get your medication covered by your insurance company, please allow [place] 1-2 business days to complete this process.  Drug prices often vary depending on where the prescription is filled and some pharmacies may offer cheaper prices.  The website www.goodrx.com contains coupons for medications through different pharmacies. The prices here do not account for what the cost may be with help from insurance (it may be cheaper with your insurance), but the website can give you the price if you did not use any insurance.  - You can print the associated coupon and take it with your prescription to the pharmacy.  - You may also stop by our office during regular business hours and pick up a GoodRx coupon  card.  - If you need your prescription sent electronically to a different pharmacy, notify our office through [facility] or by phone at [phone] option 4.     Si Usted Necesita Algo Despus de Su Visita  Tambin puede enviarnos un mensaje a travs de Pharmacist, community. Por lo general respondemos a los mensajes de MyChart en el transcurso de 1 a 2 das hbiles.  Para renovar recetas, por favor pida a su farmacia que se ponga en contacto con nuestra oficina. Harland Dingwall de fax es [place] [phone].  Si tiene un asunto urgente cuando la clnica est cerrada y que no puede esperar hasta el siguiente da hbil, puede llamar/localizar a su doctor(a) al nmero que aparece a continuacin.   Por favor, tenga en cuenta que aunque hacemos todo lo posible para estar disponibles para asuntos urgentes fuera del horario de oficina, no estamos disponibles las  [place], los 7 das de la [place].   Si tiene un problema urgente y no puede comunicarse con nosotros, puede optar por buscar atencin mdica  en el consultorio de su doctor(a), en una clnica privada, en un centro de atencin urgente o en una sala de emergencias.  Si tiene Engineering geologist, por favor llame inmediatamente al 911 o vaya a la sala de emergencias.  Nmeros de bper  - Dr. Nehemiah Massed: [phone]  - Dra. Moye: [phone]  - Dra. Nicole Kindred: [phone]  En caso de inclemencias del [place], por favor llame a Johnsie Kindred principal al [phone] para una actualizacin sobre el [place] de cualquier retraso o cierre.  Consejos para la medicacin en dermatologa: Por favor, guarde las cajas en las que vienen los medicamentos de uso tpico para ayudarle a seguir las instrucciones sobre dnde y cmo usarlos. Las farmacias generalmente imprimen las instrucciones del medicamento slo en las cajas y no directamente en los tubos del [place].   Si su medicamento es muy caro, por favor, pngase en contacto con Zigmund Daniel llamando al [phone] y presione la opcin 4 o envenos un mensaje a travs de Pharmacist, community.   No podemos decirle cul ser su copago por los medicamentos por adelantado ya que esto es diferente dependiendo de la cobertura de su seguro. Sin embargo, es posible que podamos encontrar un medicamento sustituto a Electrical engineer un formulario para que el seguro cubra el medicamento que se considera necesario.   Si se requiere una autorizacin previa para que su compaa de seguros [place] su medicamento, por favor permtanos de 1 a 2 das hbiles para completar este proceso.  Los precios de los medicamentos varan con frecuencia dependiendo del Environmental consultant de dnde se surte la receta y alguna farmacias pueden ofrecer precios ms baratos.  El sitio web www.goodrx.com tiene cupones para medicamentos de Airline pilot. Los precios aqu no tienen en cuenta lo que podra costar con la ayuda del seguro (puede ser ms barato con su seguro), pero el sitio web puede darle el precio si no utiliz Research scientist (physical sciences).  - Puede imprimir el cupn correspondiente y llevarlo con su receta a la farmacia.  - Tambin puede pasar por nuestra oficina durante el horario de atencin regular y Charity fundraiser una tarjeta de cupones de GoodRx.  - Si necesita que su receta se enve electrnicamente a una farmacia diferente, informe a nuestra oficina a travs de MyChart de [place] o por telfono llamando al [phone] y presione la opcin 4.

## 2022-06-03 ENCOUNTER — Encounter: Payer: Self-pay | Admitting: Dermatology

## 2022-09-24 ENCOUNTER — Ambulatory Visit: Payer: BC Managed Care – PPO | Admitting: Dermatology

## 2023-03-09 ENCOUNTER — Encounter: Payer: Self-pay | Admitting: Internal Medicine

## 2023-05-19 ENCOUNTER — Ambulatory Visit (INDEPENDENT_AMBULATORY_CARE_PROVIDER_SITE_OTHER): Payer: 59 | Admitting: Internal Medicine

## 2023-05-19 ENCOUNTER — Encounter: Payer: Self-pay | Admitting: Internal Medicine

## 2023-05-19 VITALS — BP 118/70 | HR 102 | Ht 64.0 in | Wt 127.0 lb

## 2023-05-19 DIAGNOSIS — K5900 Constipation, unspecified: Secondary | ICD-10-CM | POA: Diagnosis not present

## 2023-05-19 DIAGNOSIS — R1310 Dysphagia, unspecified: Secondary | ICD-10-CM

## 2023-05-19 DIAGNOSIS — Z1211 Encounter for screening for malignant neoplasm of colon: Secondary | ICD-10-CM | POA: Diagnosis not present

## 2023-05-19 DIAGNOSIS — K219 Gastro-esophageal reflux disease without esophagitis: Secondary | ICD-10-CM | POA: Diagnosis not present

## 2023-05-19 MED ORDER — NA SULFATE-K SULFATE-MG SULF 17.5-3.13-1.6 GM/177ML PO SOLN: 1.0000 | Freq: Once | ORAL | 0 refills | Status: AC

## 2023-05-19 NOTE — Progress Notes (Signed)
HISTORY OF PRESENT ILLNESS:  Nancy Kirk is a 57 y.o. 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  She is new to this practice.  Patient tells me that she has had longstanding GI issues.  Problems with constipation, bloating, reflux, dysphagia.  Also reports a hiatal hernia.  She had some sort of workup including CT scan in [place] 20 years ago (per her report).  Also, reports having had colonoscopy with Dr. Loreta Ave 11 years ago.  This was apparently unremarkable except for hemorrhoids/fissure.  Told to follow-up in 10 years.  First, the patient does have intermittent reflux symptoms several times per month for which she will take Pepto-Bismol.  She also reports intermittent dysphagia to solids and pills.  Also a thick globus type sensation.  Next, she has had chronic constipation.  She does have a bowel movement most days.  She has tried different agents including cascara, senna, MiraLAX.  She uses these more on demand.  Variable results.  No family history of colon cancer.  No bleeding.  Weight has been stable.  Does not have a primary care provider.  She is seeing a holistic specialist suggested that she may have SIBO (no testing).  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  As such, she avoids these items as much as possible.  She has not been tested formally for celiac disease or lactose intolerance.  REVIEW OF SYSTEMS:  All non-GI ROS negative unless otherwise stated in the HPI except for sinus allergy trouble, anxiety, headaches  Past Medical History:  Diagnosis Date   ADHD (attention deficit hyperactivity disorder)    Anal fissure    Anxiety    Depression    Uterine fibroid     Past Surgical History:  Procedure Laterality Date   bladder polyp removal      TONSILLECTOMY      Social History Nancy Kirk  reports that she has never smoked. She has never used  smokeless tobacco. She reports that she does not currently use alcohol after a past usage of about 4.0 standard drinks of alcohol per week. She reports that she does not use drugs.  family history includes Anxiety disorder in her mother; Depression in her mother; Diabetes in her maternal grandfather and maternal grandmother; Heart disease in her paternal grandfather and paternal grandmother; Hyperlipidemia in her mother; Hypertension in her mother; Irritable bowel syndrome in her maternal grandmother, mother, and sister.  Allergies  Allergen Reactions   Morphine Hives   Morphine And Codeine Rash    Itchy       PHYSICAL EXAMINATION: Vital signs: BP 118/70   Pulse (!) 102   Ht 5\' 4"  (1.626 m)   Wt 127 lb (57.6 kg)   LMP 11/17/2013   BMI 21.80 kg/m   Constitutional: generally well-appearing, no acute distress Psychiatric: alert and oriented x3, cooperative Eyes: extraocular movements intact, anicteric, conjunctiva pink Mouth: oral pharynx moist, no lesions Neck: supple no lymphadenopathy Cardiovascular: heart regular rate and rhythm, no murmur Lungs: clear to auscultation bilaterally Abdomen: soft, nontender, nondistended, no obvious ascites, no peritoneal signs, normal bowel sounds, no organomegaly Rectal: Deferred to colonoscopy Extremities: no lower extremity edema bilaterally Skin: no lesions on visible extremities Neuro: No focal deficits.  Cranial nerves intact  ASSESSMENT:  1.  GERD.  Intermittent symptoms treated with on-demand Pepto-Bismol 2.  Intermittent solid food dysphagia.  Rule out peptic stricture 3.  Globus type sensation 4.  Chronic constipation and bloating 5.  Reports negative colonoscopy 11 years ago 6.  Reports a history of anal fissure 7.  No PCP  PLAN:  1.  Reflux precautions 2.  Schedule upper endoscopy with duodenal biopsies and possible esophageal dilation.The nature of the procedure, as well as the risks, benefits, and alternatives were  carefully and thoroughly reviewed with the patient. Ample time for discussion and questions allowed. The patient understood, was satisfied, and agreed to proceed. 3.  May need PPI.  Discussed 4.  Colonoscopy for colon cancer screening and to evaluate lower GI complaints.The nature of the procedure, as well as the risks, benefits, and alternatives were carefully and thoroughly reviewed with the patient. Ample time for discussion and questions allowed. The patient understood, was satisfied, and agreed to proceed. 5.  May consider trial of Linzess 6.  Further recommendations and follow-up to be determined after the above. 7.  She needs to secure PCP A total time of 60 minutes was spent preparing to see the patient, obtaining comprehensive history, performing medically appropriate physical examination, counseling educating the patient regarding the above listed issues, ordering multiple endoscopic procedures, and documenting clinical information in the health record

## 2023-05-19 NOTE — Patient Instructions (Signed)
You have been scheduled for an endoscopy and colonoscopy. Please follow the written instructions given to you at your visit today.  Please pick up your prep supplies at the pharmacy within the next 1-3 days.  If you use inhalers (even only as needed), please bring them with you on the day of your procedure.  DO NOT TAKE 7 DAYS PRIOR TO TEST- Trulicity (dulaglutide) Ozempic, Wegovy (semaglutide) Mounjaro (tirzepatide) Bydureon Bcise (exanatide extended release)  DO NOT TAKE 1 DAY PRIOR TO YOUR TEST Rybelsus (semaglutide) Adlyxin (lixisenatide) Victoza (liraglutide) Byetta (exanatide) ___________________________________________________________________________  _______________________________________________________  If your blood pressure at your visit was 140/90 or greater, please contact your primary care physician to follow up on this.  _______________________________________________________  If you are age 60 or older, your body mass index should be between 23-30. Your Body mass index is 21.8 kg/m. If this is out of the aforementioned range listed, please consider follow up with your Primary Care Provider.  If you are age 40 or younger, your body mass index should be between 19-25. Your Body mass index is 21.8 kg/m. If this is out of the aformentioned range listed, please consider follow up with your Primary Care Provider.   ________________________________________________________  The [facility] providers would like to encourage you to use [facility] to communicate with providers for non-urgent requests or questions.  Due to long hold times on the telephone, sending your provider a message by [facility] may be a faster and more efficient way to get a response.  Please allow 48 business hours for a response.  Please remember that this is for non-urgent requests.  _______________________________________________________

## 2023-06-03 ENCOUNTER — Ambulatory Visit: Payer: 59 | Admitting: Dermatology

## 2023-06-03 DIAGNOSIS — Z1283 Encounter for screening for malignant neoplasm of skin: Secondary | ICD-10-CM

## 2023-06-03 DIAGNOSIS — W908XXA Exposure to other nonionizing radiation, initial encounter: Secondary | ICD-10-CM

## 2023-06-03 DIAGNOSIS — L578 Other skin changes due to chronic exposure to nonionizing radiation: Secondary | ICD-10-CM | POA: Diagnosis not present

## 2023-06-03 DIAGNOSIS — L814 Other melanin hyperpigmentation: Secondary | ICD-10-CM

## 2023-06-03 DIAGNOSIS — D229 Melanocytic nevi, unspecified: Secondary | ICD-10-CM

## 2023-06-03 DIAGNOSIS — Z808 Family history of malignant neoplasm of other organs or systems: Secondary | ICD-10-CM

## 2023-06-03 DIAGNOSIS — L219 Seborrheic dermatitis, unspecified: Secondary | ICD-10-CM | POA: Diagnosis not present

## 2023-06-03 DIAGNOSIS — L905 Scar conditions and fibrosis of skin: Secondary | ICD-10-CM

## 2023-06-03 DIAGNOSIS — D1801 Hemangioma of skin and subcutaneous tissue: Secondary | ICD-10-CM

## 2023-06-03 DIAGNOSIS — Z872 Personal history of diseases of the skin and subcutaneous tissue: Secondary | ICD-10-CM

## 2023-06-03 DIAGNOSIS — L821 Other seborrheic keratosis: Secondary | ICD-10-CM

## 2023-06-03 DIAGNOSIS — D239 Other benign neoplasm of skin, unspecified: Secondary | ICD-10-CM

## 2023-06-03 DIAGNOSIS — D2372 Other benign neoplasm of skin of left lower limb, including hip: Secondary | ICD-10-CM

## 2023-06-03 MED ORDER — CLOBETASOL PROPIONATE 0.05 % EX SOLN
CUTANEOUS | 6 refills | Status: AC
Start: 2023-06-03 — End: ?

## 2023-06-03 MED ORDER — KETOCONAZOLE 2 % EX SHAM
1.0000 | MEDICATED_SHAMPOO | CUTANEOUS | 11 refills | Status: AC
Start: 2023-06-03 — End: ?

## 2023-06-03 MED ORDER — CICLOPIROX OLAMINE 0.77 % EX SUSP
1.0000 | Freq: Every day | CUTANEOUS | 11 refills | Status: AC
Start: 2023-06-03 — End: ?

## 2023-06-03 NOTE — Progress Notes (Signed)
Follow-Up Visit   Subjective  Nancy Kirk is a 57 y.o. female who presents for the following: Skin Cancer Screening and Full Body Skin Exam  The patient presents for Total-Body Skin Exam (TBSE) for skin cancer screening and mole check. The patient has spots, moles and lesions to be evaluated, some may be new or changing and the patient may have concern these could be cancer.  Patient with hx of AK's. Also with hx of seb derm. Patient uses ciclopirox suspension and clobetasol solution a few times monthly. She does continue to itch at scalp.   FAMILY HISTORY OF SKIN CANCER What type(s): BCC or SCC (patient unsure) Who affected: mother  The following portions of the chart were reviewed this encounter and updated as appropriate: medications, allergies, medical history  Review of Systems:  No other skin or systemic complaints except as noted in HPI or Assessment and Plan.  Objective  Well appearing patient in no apparent distress; mood and affect are within normal limits.  A full examination was performed including scalp, head, eyes, ears, nose, lips, neck, chest, axillae, abdomen, back, buttocks, bilateral upper extremities, bilateral lower extremities, hands, feet, fingers, toes, fingernails, and toenails. All findings within normal limits unless otherwise noted below.   Relevant physical exam findings are noted in the Assessment and Plan.    Assessment & Plan   SKIN CANCER SCREENING PERFORMED TODAY.  ACTINIC DAMAGE - Chronic condition, secondary to cumulative UV/sun exposure - diffuse scaly erythematous macules with underlying dyspigmentation - Recommend daily broad spectrum sunscreen SPF 30+ to sun-exposed areas, reapply every 2 hours as needed.  - Staying in the shade or wearing long sleeves, sun glasses (UVA+UVB protection) and wide brim hats (4-inch brim around the entire circumference of the hat) are also recommended for sun protection.  - Call for new or changing  lesions.  LENTIGINES, SEBORRHEIC KERATOSES, HEMANGIOMAS - Benign normal skin lesions - Benign-appearing - Call for any changes  MELANOCYTIC NEVI - Tan-brown and/or pink-flesh-colored symmetric macules and papules - Benign appearing on exam today - Observation - Call clinic for new or changing moles - Recommend daily use of broad spectrum spf 30+ sunscreen to sun-exposed areas.   SEBORRHEIC DERMATITIS Exam: Pink patches at scalp  Chronic and persistent condition with duration or expected duration over one year. Condition is symptomatic/ bothersome to patient. Not currently at goal.   Seborrheic Dermatitis is a chronic persistent rash characterized by pinkness and scaling most commonly of the mid face but also can occur on the scalp (dandruff), ears; mid chest, mid back and groin.  It tends to be exacerbated by stress and cooler weather.  People who have neurologic disease may experience new onset or exacerbation of existing seborrheic dermatitis.  The condition is not curable but treatable and can be controlled.  Treatment Plan: Continue clobetasol 0.05% solution daily prn itch. Avoid applying to face, groin, and axilla. Use as directed. Long-term use can cause thinning of the skin. Continue ciclopirox suspension daily prn.  Continue ketoconazole 2% shampoo apply three times per week, massage into scalp and leave in for 10 minutes before rinsing out  Topical steroids (such as triamcinolone, fluocinolone, fluocinonide, mometasone, clobetasol, halobetasol, betamethasone, hydrocortisone) can cause thinning and lightening of the skin if they are used for too long in the same area. Your physician has selected the right strength medicine for your problem and area affected on the body. Please use your medication only as directed by your physician to prevent side effects.  DERMATOFIBROMA Exam: Firm pink/brown papulenodule with dimple sign at left lower anterior leg, left lateral  thigh. Treatment Plan: A dermatofibroma is a benign growth possibly related to trauma, such as an insect bite, cut from shaving, or inflamed acne-type bump.  Treatment options to remove include shave or excision with resulting scar and risk of recurrence.  Since benign-appearing and not bothersome, will observe for now.   HISTORY OF PRECANCEROUS ACTINIC KERATOSIS - site(s) of PreCancerous Actinic Keratosis clear today at right knee. - these may recur and new lesions may form requiring treatment to prevent transformation into skin cancer - observe for new or changing spots and contact [facility] for appointment if occur - photoprotection with sun protective clothing; sunglasses and broad spectrum sunscreen with SPF of at least 30 + and frequent self skin exams recommended - yearly exams by a dermatologist recommended for persons with history of PreCancerous Actinic Keratoses  Multiple benign nevi  Seborrheic dermatitis  Related Medications ciclopirox (LOPROX) 0.77 % SUSP Apply 1 application  topically daily. And as needed weekly  ketoconazole (NIZORAL) 2 % shampoo Apply 1 Application topically 3 (three) times a week.  clobetasol (TEMOVATE) 0.05 % external solution APPLY TOPICALLY TO THE SCALP DAILY AS NEEDED FOR ITCHING. AVOID FACE, GROIN, UNDERARMS  Actinic elastosis  Seborrheic keratoses  Dermatofibroma  Cherry angioma  SCAR Exam: Dyspigmented smooth macule or patch at abdomen. Benign-appearing.  Observation.  Call clinic for new or changing lesions. Recommend daily broad spectrum sunscreen SPF 30+, reapply every 2 hours as needed. Treatment: Recommend Serica moisturizing scar formula cream every night or [employer] brand or Mederma silicone scar sheet every night for the first year after a scar appears to help with scar remodeling if desired. Scars remodel on their own for a full year and will gradually improve in appearance over time.   Return in about 1 year  (around 06/02/2024) for TBSE, Seb Derm, Hx AK.  Anise Salvo, RMA, am acting as scribe for Elie Goody, MD .   Documentation: I have reviewed the above documentation for accuracy and completeness, and I agree with the above.  Elie Goody, MD

## 2023-06-03 NOTE — Patient Instructions (Signed)
Continue clobetasol 0.05% solution daily as needed for itch. Avoid applying to face, groin, and axilla. Use as directed. Long-term use can cause thinning of the skin. Continue ciclopirox suspension daily as needed.  Continue ketoconazole 2% shampoo apply three times per week, massage into scalp and leave in for 10 minutes before rinsing out  Topical steroids (such as triamcinolone, fluocinolone, fluocinonide, mometasone, clobetasol, halobetasol, betamethasone, hydrocortisone) can cause thinning and lightening of the skin if they are used for too long in the same area. Your physician has selected the right strength medicine for your problem and area affected on the body. Please use your medication only as directed by your physician to prevent side effects.   Melanoma ABCDEs  Melanoma is the most dangerous type of skin cancer, and is the leading cause of death from skin disease.  You are more likely to develop melanoma if you: Have light-colored skin, light-colored eyes, or red or blond hair Spend a lot of time in the sun Tan regularly, either outdoors or in a tanning bed Have had blistering sunburns, especially during childhood Have a close family member who has had a melanoma Have atypical moles or large birthmarks  Early detection of melanoma is key since treatment is typically straightforward and cure rates are extremely high if we catch it early.   The first sign of melanoma is often a change in a mole or a new dark spot.  The ABCDE system is a way of remembering the signs of melanoma.  A for asymmetry:  The two halves do not match. B for border:  The edges of the growth are irregular. C for color:  A mixture of colors are present instead of an even brown color. D for diameter:  Melanomas are usually (but not always) greater than 6mm - the size of a pencil eraser. E for evolution:  The spot keeps changing in size, shape, and color.  Please check your skin once per month between visits.  You can use a small mirror in front and a large mirror behind you to keep an eye on the back side or your body.   If you see any new or changing lesions before your next follow-up, please call to schedule a visit.  Please continue daily skin protection including broad spectrum sunscreen SPF 30+ to sun-exposed areas, reapplying every 2 hours as needed when you're outdoors.    Due to recent changes in healthcare laws, you may see results of your pathology and/or laboratory studies on MyChart before the doctors have had a chance to review them. We understand that in some cases there may be results that are confusing or concerning to you. Please understand that not all results are received at the same time and often the doctors may need to interpret multiple results in order to provide you with the best plan of care or course of treatment. Therefore, we ask that you please give [place] 2 business days to thoroughly review all your results before contacting the office for clarification. Should we see a critical lab result, you will be contacted sooner.   If You Need Anything After Your Visit  If you have any questions or concerns for your doctor, please call our main line at [phone] and press option 4 to reach your doctor's medical assistant. If no one answers, please leave a voicemail as directed and we will return your call as soon as possible. Messages left after 4 pm will be answered the following business day.  You may also send [place] a message via MyChart. We typically respond to MyChart messages within 1-2 business days.  For prescription refills, please ask your pharmacy to contact our office. Our fax number is [phone].  If you have an urgent issue when the clinic is closed that cannot wait until the next business day, you can page your doctor at the number below.    Please note that while we do our best to be available for urgent issues outside of office hours, we are not available 24/7.    If you have an urgent issue and are unable to reach [place], you may choose to seek medical care at your doctor's office, retail clinic, urgent care center, or emergency room.  If you have a medical emergency, please immediately call 911 or go to the emergency department.  Pager Numbers  - Dr. Gwen Pounds: [phone]  - Dr. Neale Burly: [phone]  - Dr. Roseanne Reno: [phone]  In the event of inclement weather, please call our main line at [phone] for an update on the status of any delays or closures.  Dermatology Medication Tips: Please keep the boxes that topical medications come in in order to help keep track of the instructions about where and how to use these. Pharmacies typically print the medication instructions only on the boxes and not directly on the medication tubes.   If your medication is too expensive, please contact our office at [phone] option 4 or send [place] a message through MyChart.   We are unable to tell what your co-pay for medications will be in advance as this is different depending on your insurance coverage. However, we may be able to find a substitute medication at lower cost or fill out paperwork to get insurance to cover a needed medication.   If a prior authorization is required to get your medication covered by your insurance company, please allow [place] 1-2 business days to complete this process.  Drug prices often vary depending on where the prescription is filled and some pharmacies may offer cheaper prices.  The website www.goodrx.com contains coupons for medications through different pharmacies. The prices here do not account for what the cost may be with help from insurance (it may be cheaper with your insurance), but the website can give you the price if you did not use any insurance.  - You can print the associated coupon and take it with your prescription to the pharmacy.  - You may also stop by our office during regular business hours and pick up a  GoodRx coupon card.  - If you need your prescription sent electronically to a different pharmacy, notify our office through [facility] or by phone at [phone] option 4.

## 2023-06-10 ENCOUNTER — Encounter: Payer: Self-pay | Admitting: Internal Medicine

## 2023-06-17 ENCOUNTER — Telehealth: Payer: Self-pay | Admitting: Gastroenterology

## 2023-06-17 ENCOUNTER — Encounter (HOSPITAL_BASED_OUTPATIENT_CLINIC_OR_DEPARTMENT_OTHER): Payer: Self-pay

## 2023-06-17 ENCOUNTER — Emergency Department (HOSPITAL_BASED_OUTPATIENT_CLINIC_OR_DEPARTMENT_OTHER)
Admission: EM | Admit: 2023-06-17 | Discharge: 2023-06-18 | Discharge status: Against Medical Advice | Payer: 59 | Attending: Emergency Medicine | Admitting: Emergency Medicine

## 2023-06-17 ENCOUNTER — Other Ambulatory Visit: Payer: Self-pay

## 2023-06-17 DIAGNOSIS — Z5321 Procedure and treatment not carried out due to patient leaving prior to being seen by health care provider: Secondary | ICD-10-CM | POA: Diagnosis not present

## 2023-06-17 DIAGNOSIS — L299 Pruritus, unspecified: Secondary | ICD-10-CM | POA: Diagnosis not present

## 2023-06-17 DIAGNOSIS — R21 Rash and other nonspecific skin eruption: Secondary | ICD-10-CM | POA: Insufficient documentation

## 2023-06-17 NOTE — Telephone Encounter (Signed)
Received page to on call. Patient started bowel prep and now with diffuse rash on b/l arms, trunk, and mildly on legs. No SOB, CP. Thinks she has may a sulfa allergy in the past (but not one documented in EMR). Does have some mild rash on face/neck.   Took zyrtec without improvement.   In light of sxs, my recommendation is to go to the ER or UC for expedited eval and treatment of likely allergic reaction to prep.   I advise rescheduling procedures, but she was still very hopeful to go through with procedures tomorrow. I explained that it would all be dependent on the ER eval. If just mild cutaneous sxs and quickly resolve with ER treatment, possible to do Miralax prep in the AM since her procedure is not till 3:00 tomorrow. Of course if any neck edema, respiratory sxs, or other concerns by ER staff, then would cancel all procedures tomorrow. Will see what happens with eval first.   All questions answered and appreciative of call back. Will route to her primary GI for awareness.

## 2023-06-17 NOTE — ED Notes (Signed)
Pt has left per registration 

## 2023-06-17 NOTE — ED Triage Notes (Signed)
Pt started colonoscopy prep (Sodium sulfate and magnesium sulfate) began having reaction Rash noted all over & itching No difficulty breathing Took zyrtec PTA

## 2023-06-18 ENCOUNTER — Ambulatory Visit (AMBULATORY_SURGERY_CENTER): Payer: 59 | Admitting: Internal Medicine

## 2023-06-18 ENCOUNTER — Encounter: Payer: Self-pay | Admitting: Internal Medicine

## 2023-06-18 ENCOUNTER — Telehealth: Payer: Self-pay

## 2023-06-18 VITALS — BP 111/66 | HR 90 | Temp 98.9°F | Resp 15 | Ht 64.0 in | Wt 127.0 lb

## 2023-06-18 DIAGNOSIS — K6389 Other specified diseases of intestine: Secondary | ICD-10-CM | POA: Diagnosis not present

## 2023-06-18 DIAGNOSIS — Z1211 Encounter for screening for malignant neoplasm of colon: Secondary | ICD-10-CM

## 2023-06-18 DIAGNOSIS — D120 Benign neoplasm of cecum: Secondary | ICD-10-CM | POA: Diagnosis not present

## 2023-06-18 DIAGNOSIS — R1310 Dysphagia, unspecified: Secondary | ICD-10-CM

## 2023-06-18 DIAGNOSIS — K222 Esophageal obstruction: Secondary | ICD-10-CM

## 2023-06-18 DIAGNOSIS — K219 Gastro-esophageal reflux disease without esophagitis: Secondary | ICD-10-CM | POA: Diagnosis not present

## 2023-06-18 DIAGNOSIS — K5900 Constipation, unspecified: Secondary | ICD-10-CM

## 2023-06-18 LAB — I-STAT CHEM 8, ED
BUN: 14 mg/dL (ref 6–20)
Calcium, Ion: 1.21 mmol/L (ref 1.15–1.40)
Chloride: 102 mmol/L (ref 98–111)
Creatinine, Ser: 1 mg/dL (ref 0.44–1.00)
Glucose, Bld: 97 mg/dL (ref 70–99)
HCT: 47 % — ABNORMAL HIGH (ref 36.0–46.0)
Hemoglobin: 16 g/dL — ABNORMAL HIGH (ref 12.0–15.0)
Potassium: 3.7 mmol/L (ref 3.5–5.1)
Sodium: 138 mmol/L (ref 135–145)
TCO2: 24 mmol/L (ref 22–32)

## 2023-06-18 MED ORDER — SODIUM CHLORIDE 0.9 % IV SOLN: 500.0000 mL | Freq: Once | INTRAVENOUS | Status: DC

## 2023-06-18 MED ORDER — PANTOPRAZOLE SODIUM 40 MG PO TBEC: 40.0000 mg | DELAYED_RELEASE_TABLET | Freq: Every day | ORAL | 11 refills | Status: AC

## 2023-06-18 NOTE — Progress Notes (Deleted)
Advised patient with Dr. Marina Goodell recommendations. Patient agreed to proceed with the colonoscopy. For colon prep she will complete 119 mg of Miralax mixed with 32 oz Gatorade and finish it by 12 pm today. Patient verbalized understanding and agrees to POC.

## 2023-06-18 NOTE — Telephone Encounter (Signed)
Nancy Kirk, If she is otherwise feeling well (no fevers, breathing well, etc.) then I would recommend she proceed as planned.  You can help her make sure her preparation is adequate. Thanks. JP

## 2023-06-18 NOTE — Patient Instructions (Signed)
Start Pantoprazole 40 mg once daily  Trial of linzess, if effective call office for prescription   YOU HAD AN ENDOSCOPIC PROCEDURE TODAY AT THE [facility]:   Refer to the procedure report that was given to you for any specific questions about what was found during the examination.  If the procedure report does not answer your questions, please call your gastroenterologist to clarify.  If you requested that your care partner not be given the details of your procedure findings, then the procedure report has been included in a sealed envelope for you to review at your convenience later.  YOU SHOULD EXPECT: Some feelings of bloating in the abdomen. Passage of more gas than usual.  Walking can help get rid of the air that was put into your GI tract during the procedure and reduce the bloating. If you had a lower endoscopy (such as a colonoscopy or flexible sigmoidoscopy) you may notice spotting of blood in your stool or on the toilet paper. If you underwent a bowel prep for your procedure, you may not have a normal bowel movement for a few days.  Please Note:  You might notice some irritation and congestion in your nose or some drainage.  This is from the oxygen used during your procedure.  There is no need for concern and it should clear up in a day or so.  SYMPTOMS TO REPORT IMMEDIATELY:  Following lower endoscopy (colonoscopy or flexible sigmoidoscopy):  Excessive amounts of blood in the stool  Significant tenderness or worsening of abdominal pains  Swelling of the abdomen that is new, acute  Fever of 100F or higher  Following upper endoscopy (EGD)  Vomiting of blood or coffee ground material  New chest pain or pain under the shoulder blades  Painful or persistently difficult swallowing  New shortness of breath  Fever of 100F or higher  Black, tarry-looking stools  For urgent or emergent issues, a gastroenterologist can be reached at any hour by calling [phone]. Do not  use MyChart messaging for urgent concerns.    DIET:  We do recommend a small meal at first, but then you may proceed to your regular diet.  Drink plenty of fluids but you should avoid alcoholic beverages for 24 hours.  ACTIVITY:  You should plan to take it easy for the rest of today and you should NOT DRIVE or use heavy machinery until tomorrow (because of the sedation medicines used during the test).    FOLLOW UP: Our staff will call the number listed on your records the next business day following your procedure.  We will call around 7:15- 8:00 am to check on you and address any questions or concerns that you may have regarding the information given to you following your procedure. If we do not reach you, we will leave a message.     If any biopsies were taken you will be contacted by phone or by letter within the next 1-3 weeks.  Please call us at [phone] if you have not heard about the biopsies in 3 weeks.    SIGNATURES/CONFIDENTIALITY: You and/or your care partner have signed paperwork which will be entered into your electronic medical record.  These signatures attest to the fact that that the information above on your After Visit Summary has been reviewed and is understood.  Full responsibility of the confidentiality of this discharge information lies with you and/or your care-partner.

## 2023-06-18 NOTE — Op Note (Signed)
[facility] Patient Name: Nancy Kirk Procedure Date: 06/18/2023 2:24 PM MRN: [id number] Endoscopist: Wilhemina Bonito. Marina Goodell , MD, [id number] Age: 57 Referring MD:  Date of Birth: [birth year] Gender: Female Account #: [id number] Procedure:                Upper GI endoscopy with balloon dilation of the                            esophagus?"20 mm. Biopsies Indications:              Dysphagia, Esophageal reflux, globus sensation Medicines:                Monitored Anesthesia Care Procedure:                Pre-Anesthesia Assessment:                           - Prior to the procedure, a History and Physical                            was performed, and patient medications and                            allergies were reviewed. The patient's tolerance of                            previous anesthesia was also reviewed. The risks                            and benefits of the procedure and the sedation                            options and risks were discussed with the patient.                            All questions were answered, and informed consent                            was obtained. Prior Anticoagulants: The patient has                            taken no anticoagulant or antiplatelet agents. ASA                            Grade Assessment: II - A patient with mild systemic                            disease. After reviewing the risks and benefits,                            the patient was deemed in satisfactory condition to                            undergo the procedure.  After obtaining informed consent, the endoscope was                            passed under direct vision. Throughout the                            procedure, the patient's blood pressure, pulse, and                            oxygen saturations were monitored continuously. The                            Olympus Scope [id number] was introduced through the                             mouth, and advanced to the second part of duodenum.                            The upper GI endoscopy was accomplished without                            difficulty. The patient tolerated the procedure                            well. Scope In: Scope Out: 3:09:39 PM Findings:                 The esophagus was essentially normal. There was                            mild narrowing at the gastroesophageal junction.                            This was dilated with a sequential balloon 18-19-20                            mm, to 20 mm max. No resistance or heme..                           The stomach was normal, save small hiatal hernia.                           The examined duodenum was normal. Biopsies for                            histology were taken with a cold forceps for                            evaluation of celiac disease.                           The cardia and gastric fundus were normal on  retroflexion. Complications:            No immediate complications. Estimated Blood Loss:     Estimated blood loss: none. Impression:               1. Very mild narrowing of the distal esophagus                            status post balloon dilation. Otherwise normal                            esophagus.                           2. Small hiatal hernia                           3. Otherwise normal EGD                           4. Duodenal biopsies taken to rule out gluten                            sensitive enteropathy. Recommendation:           - Patient has a contact number available for                            emergencies. The signs and symptoms of potential                            delayed complications were discussed with the                            patient. Return to normal activities tomorrow.                            Written discharge instructions were provided to the                            patient.                           - Resume  previous diet.                           - Continue present medications.                           - Await pathology results.                           -PRESCRIBE PANTOPRAZOLE 40 mg daily; #30; 11                            refills. Please take this acid reflux medicine once  daily until your follow-up appointment with Dr.                            Marina Goodell                           ?" Office follow-up with Dr. Marina Goodell in 6 to 8 weeks Wilhemina Bonito. Marina Goodell, MD 06/18/2023 3:16:11 PM This report has been signed electronically.

## 2023-06-18 NOTE — Progress Notes (Signed)
Expand All Collapse All HISTORY OF PRESENT ILLNESS:   Nancy Kirk is a 57 y.o. 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  She is new to this practice.   Patient tells me that she has had longstanding GI issues.  Problems with constipation, bloating, reflux, dysphagia.  Also reports a hiatal hernia.  She had some sort of workup including CT scan in [place] 20 years ago (per her report).  Also, reports having had colonoscopy with Dr. Loreta Ave 11 years ago.  This was apparently unremarkable except for hemorrhoids/fissure.  Told to follow-up in 10 years.   First, the patient does have intermittent reflux symptoms several times per month for which she will take Pepto-Bismol.  She also reports intermittent dysphagia to solids and pills.  Also a thick globus type sensation.   Next, she has had chronic constipation.  She does have a bowel movement most days.  She has tried different agents including cascara, senna, MiraLAX.  She uses these more on demand.  Variable results.  No family history of colon cancer.  No bleeding.  Weight has been stable.  Does not have a primary care provider.  She is seeing a holistic specialist suggested that she may have SIBO (no testing).  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  As such, she avoids these items as much as possible.  She has not been tested formally for celiac disease or lactose intolerance.   REVIEW OF SYSTEMS:   All non-GI ROS negative unless otherwise stated in the HPI except for sinus allergy trouble, anxiety, headaches       Past Medical History:  Diagnosis Date   ADHD (attention deficit hyperactivity disorder)     Anal fissure     Anxiety     Depression     Uterine fibroid                 Past Surgical History:  Procedure Laterality Date   bladder polyp removal        TONSILLECTOMY              Social History JULIAANN HELMKE  reports that she has never smoked. She has never used smokeless tobacco. She reports that she does not currently use alcohol after a past usage of about 4.0 standard drinks of alcohol per week. She reports that she does not use drugs.   family history includes Anxiety disorder in her mother; Depression in her mother; Diabetes in her maternal grandfather and maternal grandmother; Heart disease in her paternal grandfather and paternal grandmother; Hyperlipidemia in her mother; Hypertension in her mother; Irritable bowel syndrome in her maternal grandmother, mother, and sister.   Allergies       Allergies  Allergen Reactions   Morphine Hives   Morphine And Codeine Rash      Itchy            PHYSICAL EXAMINATION: Vital signs: BP 118/70   Pulse (!) 102   Ht 5\' 4"  (1.626 m)   Wt 127 lb (57.6 kg)   LMP 11/17/2013   BMI 21.80 kg/m   Constitutional: generally well-appearing, no acute distress Psychiatric: alert and oriented x3, cooperative Eyes: extraocular movements intact, anicteric, conjunctiva pink Mouth: oral pharynx moist, no lesions Neck: supple no lymphadenopathy Cardiovascular: heart regular rate and rhythm, no murmur Lungs: clear to auscultation bilaterally Abdomen: soft, nontender, nondistended, no obvious ascites,  no peritoneal signs, normal bowel sounds, no organomegaly Rectal: Deferred to colonoscopy Extremities: no lower extremity edema bilaterally Skin: no lesions on visible extremities Neuro: No focal deficits.  Cranial nerves intact   ASSESSMENT:   1.  GERD.  Intermittent symptoms treated with on-demand Pepto-Bismol 2.  Intermittent solid food dysphagia.  Rule out peptic stricture 3.  Globus type sensation 4.  Chronic constipation and bloating 5.  Reports negative colonoscopy 11 years ago 6.  Reports a history of anal fissure 7.  No PCP   PLAN:   1.  Reflux precautions 2.  Schedule upper endoscopy with duodenal biopsies and possible esophageal  dilation.The nature of the procedure, as well as the risks, benefits, and alternatives were carefully and thoroughly reviewed with the patient. Ample time for discussion and questions allowed. The patient understood, was satisfied, and agreed to proceed. 3.  May need PPI.  Discussed 4.  Colonoscopy for colon cancer screening and to evaluate lower GI complaints.The nature of the procedure, as well as the risks, benefits, and alternatives were carefully and thoroughly reviewed with the patient. Ample time for discussion and questions allowed. The patient understood, was satisfied, and agreed to proceed. 5.  May consider trial of Linzess 6.  Further recommendations and follow-up to be determined after the above. 7.  She needs to secure PCP

## 2023-06-18 NOTE — Op Note (Signed)
[facility] Patient Name: Nancy Kirk Procedure Date: 06/18/2023 2:23 PM MRN: [id number] Endoscopist: Wilhemina Bonito. Marina Goodell , MD, [id number] Age: 57 Referring MD:  Date of Birth: [birth year] Gender: Female Account #: [id number] Procedure:                Colonoscopy with cold snare polypectomy x 2; with                            biopsies Indications:              Screening for colorectal malignant neoplasm Medicines:                Monitored Anesthesia Care Procedure:                Pre-Anesthesia Assessment:                           - Prior to the procedure, a History and Physical                            was performed, and patient medications and                            allergies were reviewed. The patient's tolerance of                            previous anesthesia was also reviewed. The risks                            and benefits of the procedure and the sedation                            options and risks were discussed with the patient.                            All questions were answered, and informed consent                            was obtained. Prior Anticoagulants: The patient has                            taken no anticoagulant or antiplatelet agents. ASA                            Grade Assessment: II - A patient with mild systemic                            disease. After reviewing the risks and benefits,                            the patient was deemed in satisfactory condition to                            undergo the procedure.  After obtaining informed consent, the colonoscope                            was passed under direct vision. Throughout the                            procedure, the patient's blood pressure, pulse, and                            oxygen saturations were monitored continuously. The                            CF [id number] #[id number] was introduced through the anus                            and advanced to  the the cecum, identified by                            appendiceal orifice and ileocecal valve. The                            ileocecal valve, appendiceal orifice, and rectum                            were photographed. The quality of the bowel                            preparation was good. The colonoscopy was performed                            without difficulty. The patient tolerated the                            procedure well. The bowel preparation used was                            SUPREP via split dose instruction. Developed a rash                            during the prep. Finished up with MiraLAX. Scope In: 2:34:59 PM Scope Out: 2:52:16 PM Scope Withdrawal Time: 0 hours 14 minutes 48 seconds  Total Procedure Duration: 0 hours 17 minutes 17 seconds  Findings:                 Two polyps were found in the transverse colon and                            cecum. The polyps were 3 to 5 mm in size. These                            polyps were removed with a cold snare. Resection  and retrieval were complete.                           A diffuse area of mild melanosis was found in the                            entire colon. Biopsies were taken with a cold                            forceps for histology.                           The exam was otherwise without abnormality on                            direct and retroflexion views. Complications:            No immediate complications. Estimated blood loss:                            None. Estimated Blood Loss:     Estimated blood loss: none. Impression:               - Two 3 to 5 mm polyps in the transverse colon and                            in the cecum, removed with a cold snare. Resected                            and retrieved.                           - Melanosis in the colon. Biopsied.                           - The examination was otherwise normal on direct                            and  retroflexion views. Recommendation:           - Repeat colonoscopy in 7-10 years for surveillance.                           - Patient has a contact number available for                            emergencies. The signs and symptoms of potential                            delayed complications were discussed with the                            patient. Return to normal activities tomorrow.                            Written discharge instructions were provided  to the                            patient.                           - Resume previous diet.                           - Continue present medications.                           - Await pathology results.                           -Linzess 145 mcg. Try 1 by mouth daily for                            constipation. Provided you with samples (16 days).                            If this is effective and you would like                            prescription. Please contact the office                           -EGD today. Please see report Wilhemina Bonito. Marina Goodell, MD 06/18/2023 3:01:40 PM This report has been signed electronically.

## 2023-06-18 NOTE — Telephone Encounter (Signed)
-----   Message from Shellia Cleverly sent at 06/18/2023  3:05 AM EDT ----- Regarding: Possible allergy reaction  I spoke with this patient earlier and sent a telephone note. Possible allergic reaction to the prep. She sounds like a pretty knowledgeable person, and reports she may have had a sulfa allergy/reaction in the past (although no sulfa allergy in EMR), the developed diffuse rash shortly after drinking prep.   No response to Zyrtec at home. I told her she could use Benadryl if a mild reaction, but didn't have at home. Explained if head/neck symptoms she needs to go to ER.   I told her probably best to reschedule, but she was pretty adamant on the phone about driving ahead with procedures Thursday afternoon using miralax prep. I told her if it's a mild reaction that quickly dissipates w Benadryl, Allegra, etc, that may (and made no promises) be an option. But if any respiratory, throat sxs, ongoing reaction, etc, we would certainly postpone.   At that point it sort of became difficult to tell if sxs were mild or she was minimizing once I told her rescheduling was in the cards.   Looks like she went to [facility] ER, but left w/o being seen. Lorrine Kin, can you please call in the AM and see how she is doing and can make determination on whether or not to forge ahead vs reschedule.   Thanks.  VC

## 2023-06-18 NOTE — Telephone Encounter (Signed)
Advised patient with Dr. Marina Goodell recommendations. Patient agreed to proceed with the colonoscopy. For colon prep she will complete 119 mg of Miralax mixed with 32 oz Gatorade and finish it by 12 pm today. Patient verbalized understanding and agrees to POC.

## 2023-06-18 NOTE — Progress Notes (Signed)
See TE 06-18-23- pt changed to Miralax after reaction from [employer]

## 2023-06-18 NOTE — Telephone Encounter (Signed)
Returned pts call.  She went to the ED last night but she waited over an hour and was told that they would most likely only give her benadryl.  She left and took Benadryl once she got home.  She says that she still has the rash with some itching but denies mouth or throat swelling and does not have SOB.  She was able to drink 3/4 of prep last night and wants to know if she would be able to proceed with colonoscopy today with additional prep.  Dr. Marina Goodell please advise.

## 2023-06-18 NOTE — Telephone Encounter (Signed)
Inbound call from patient stating  that she had a allergic reaction to her prep. Patient is requesting to speak to nurse to see if Dr. Marina Goodell would like her top continue or reschedule. Please advise.

## 2023-06-18 NOTE — Telephone Encounter (Signed)
Erroneous encounter

## 2023-06-18 NOTE — Progress Notes (Signed)
Sedate, gd SR, tolerated procedure well, VSS, report to RN 

## 2023-06-18 NOTE — Telephone Encounter (Signed)
Spoke with patient, she stated that other than the rash her symptoms had resolved - she has no respiratory symptoms and has completed the Miralax as instructed.  She stated she feels good and would like to go ahead with her procedure.  She promised to call me back if anything with this changed.

## 2023-06-19 ENCOUNTER — Telehealth: Payer: Self-pay

## 2023-06-19 NOTE — Telephone Encounter (Signed)
Follow up call to pt, lm for pt to call if having any difficulty with normal activities or eating and drinking.  Also to call if any other questions or concerns.  

## 2023-06-24 ENCOUNTER — Encounter: Payer: Self-pay | Admitting: Internal Medicine

## 2023-08-04 ENCOUNTER — Encounter: Payer: Self-pay | Admitting: Internal Medicine

## 2023-08-04 ENCOUNTER — Ambulatory Visit (INDEPENDENT_AMBULATORY_CARE_PROVIDER_SITE_OTHER): Payer: 59 | Admitting: Internal Medicine

## 2023-08-04 VITALS — BP 120/80 | HR 97 | Ht 64.0 in | Wt 128.0 lb

## 2023-08-04 DIAGNOSIS — K222 Esophageal obstruction: Secondary | ICD-10-CM | POA: Diagnosis not present

## 2023-08-04 DIAGNOSIS — D120 Benign neoplasm of cecum: Secondary | ICD-10-CM

## 2023-08-04 DIAGNOSIS — K219 Gastro-esophageal reflux disease without esophagitis: Secondary | ICD-10-CM

## 2023-08-04 DIAGNOSIS — K5900 Constipation, unspecified: Secondary | ICD-10-CM

## 2023-08-04 DIAGNOSIS — R1310 Dysphagia, unspecified: Secondary | ICD-10-CM | POA: Diagnosis not present

## 2023-08-04 MED ORDER — PANTOPRAZOLE SODIUM 40 MG PO TBEC: 40.0000 mg | DELAYED_RELEASE_TABLET | Freq: Every day | ORAL | 3 refills | Status: AC

## 2023-08-04 MED ORDER — LINACLOTIDE 145 MCG PO CAPS: 145.0000 ug | ORAL_CAPSULE | Freq: Every day | ORAL | 3 refills | Status: DC

## 2023-08-04 NOTE — Progress Notes (Signed)
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  See that dictation for details.  The principal issues were GERD, intermittent solid food dysphagia, globus sensation, and chronic constipation with bloating.  She subsequently underwent colonoscopy and upper endoscopy June 18, 2023.  Colonoscopy revealed diminutive colon polyps and melanosis coli.  The polyps were adenomatous.  Follow-up in 7 years recommended.  She was given samples of Linzess 145 mcg for her constipation and bloating.  Upper endoscopy revealed mild stricturing of the distal esophagus and small hiatal hernia.  Otherwise normal.  Duodenal biopsies were normal.  She was prescribed pantoprazole 40 mg daily.  Follow-up at this time arranged.  First, she tells me that her reflux symptoms are better.  As well, intermittent solid food dysphagia is better.  She continues to have a globus type sensation.  She does have a history of anxiety/depression on meds for that condition.  Linzess was helpful in terms of her constipation and bloating.  She is interested in a prescription.  No new complaints or problems  REVIEW OF SYSTEMS:  All non-GI ROS negative unless otherwise stated in the HPI except for sinus allergy trouble, anxiety, depression, headaches, muscle cramps, sleeping problems  Past Medical History:  Diagnosis Date   ADHD (attention deficit hyperactivity disorder)    Anal fissure    Anxiety    Depression    GERD (gastroesophageal reflux disease)    Hyperlipidemia    Uterine fibroid     Past Surgical History:  Procedure Laterality Date   bladder polyp removal      TONSILLECTOMY     UTERINE FIBROID SURGERY      Social History ROSHELL ACEITUNO  reports that she has never smoked. She has never used smokeless tobacco. She reports that she does not currently use alcohol  after a past usage of about 4.0 standard drinks of alcohol per week. She reports that she does not use drugs.  family history includes Anxiety disorder in her mother; Depression in her mother; Diabetes in her maternal grandfather and maternal grandmother; Heart disease in her paternal grandfather and paternal grandmother; Hyperlipidemia in her mother; Hypertension in her mother; Irritable bowel syndrome in her maternal grandmother, mother, and sister.  Allergies  Allergen Reactions   Morphine Hives   Morphine And Codeine Rash    Itchy       PHYSICAL EXAMINATION: Vital signs: BP 120/80   Pulse 97   Ht 5\' 4"  (1.626 m)   Wt 128 lb (58.1 kg)   LMP 11/17/2013   BMI 21.97 kg/m   Constitutional: generally well-appearing, no acute distress Psychiatric: alert and oriented x3, cooperative Eyes: Anicteric  Abdomen: Not reexamined Skin: no lesions on visible extremities Neuro: No gross deficits  ASSESSMENT:  1.  GERD.  Symptoms controlled with pantoprazole 2.  Intermittent solid food dysphagia.  Better post balloon dilation of the distal esophagus, with mild stricturing. 3.  Globus sensation.  Ongoing 4.  Chronic constipation and bloating consistent with constipation predominant IBS.  Responded to Linzess therapy 5.  Recent colonoscopy with diminutive adenomas   PLAN:  1.  Reflux precautions 2.  Continue pantoprazole 40 mg daily.  Prescription refilled 3.  Prescribe Linzess 145 mcg daily.  Multiple refills. 4.  Surveillance colonoscopy in 7 years 5.  Routine office follow-up 1 year.  Contact the office in the  interim for any questions or problems A total time of 30 minutes was spent preparing to see the patient, obtaining interval history, performing medically appropriate physical exam, counseling and educating the patient regarding the above listed issues, ordering medication, defining follow-up interval, and documenting clinical information in the health record

## 2023-08-04 NOTE — Patient Instructions (Signed)
We have sent the follow:  Pantoprazole, Linzess  Linzess works best when taken once a day every day, on an empty stomach, at least 30 minutes before your first meal of the day.  When Linzess is taken daily as directed:  *Constipation relief is typically felt in about a week *IBS-C patients may begin to experience relief from belly pain and overall abdominal symptoms (pain, discomfort, and bloating) in about 1 week,   with symptoms typically improving over 12 weeks.  Diarrhea may occur in the first 2 weeks -keep taking it.  The diarrhea should go away and you should start having normal, complete, full bowel movements. It may be helpful to start treatment when you can be near the comfort of your own bathroom, such as a weekend.     Please follow up in one year ____________________________________________________  If your blood pressure at your visit was 140/90 or greater, please contact your primary care physician to follow up on this.  _______________________________________________________  If you are age 59 or older, your body mass index should be between 23-30. Your Body mass index is 21.97 kg/m. If this is out of the aforementioned range listed, please consider follow up with your Primary Care Provider.  If you are age 78 or younger, your body mass index should be between 19-25. Your Body mass index is 21.97 kg/m. If this is out of the aformentioned range listed, please consider follow up with your Primary Care Provider.   ________________________________________________________  The [facility] providers would like to encourage you to use [facility] to communicate with providers for non-urgent requests or questions.  Due to long hold times on the telephone, sending your provider a message by [facility] may be a faster and more efficient way to get a response.  Please allow 48 business hours for a response.  Please remember that this is for non-urgent requests.   _______________________________________________________

## 2023-08-12 ENCOUNTER — Ambulatory Visit (INDEPENDENT_AMBULATORY_CARE_PROVIDER_SITE_OTHER): Payer: 59 | Admitting: Podiatry

## 2023-08-12 ENCOUNTER — Encounter: Payer: Self-pay | Admitting: Podiatry

## 2023-08-12 DIAGNOSIS — L600 Ingrowing nail: Secondary | ICD-10-CM

## 2023-08-12 NOTE — Patient Instructions (Signed)

## 2023-08-13 NOTE — Progress Notes (Signed)
Subjective:   Patient ID: Nancy Kirk, female   DOB: 57 y.o.   MRN: [id number]   HPI Patient presents with severely damaged left big toenail that is been present for a long time.  States that she has tried trimming she tried oral medication and other modalities without relief of symptoms and has had this for a number of years with probable trauma.  Patient does not smoke likes to be active   Review of Systems  All other systems reviewed and are negative.       Objective:  Physical Exam  Neurovascular status intact muscle strength adequate range of motion adequate with severely thickened dystrophic painful left big toenail.  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  Explained procedure risk patient wants surgery with permanent nail removal and understands this and signed consent form after review.  Infiltrated the left big toe 60 mg like Marcaine mixture sterile prep done using sterile instrumentation remove the hallux nail exposed matrix applied phenol for applications 30 seconds followed by alcohol lavage sterile dressing gave instructions on soaks wear dressing 24 hours take it off earlier if throbbing were to occur

## 2023-08-14 ENCOUNTER — Other Ambulatory Visit: Payer: Self-pay | Admitting: Internal Medicine

## 2023-08-14 DIAGNOSIS — Z1231 Encounter for screening mammogram for malignant neoplasm of breast: Secondary | ICD-10-CM

## 2023-08-20 ENCOUNTER — Other Ambulatory Visit: Payer: Self-pay | Admitting: Medical Genetics

## 2023-08-20 DIAGNOSIS — Z006 Encounter for examination for normal comparison and control in clinical research program: Secondary | ICD-10-CM

## 2023-08-21 ENCOUNTER — Ambulatory Visit
Admission: RE | Admit: 2023-08-21 | Discharge: 2023-08-21 | Disposition: A | Discharge status: Home / Self Care | Payer: 59 | Source: Ambulatory Visit | Attending: Internal Medicine | Admitting: Internal Medicine

## 2023-08-21 DIAGNOSIS — Z1231 Encounter for screening mammogram for malignant neoplasm of breast: Secondary | ICD-10-CM

## 2024-06-02 ENCOUNTER — Encounter: Payer: 59 | Admitting: Dermatology

## 2024-06-07 ENCOUNTER — Other Ambulatory Visit: Payer: Self-pay | Admitting: Internal Medicine

## 2024-07-19 ENCOUNTER — Other Ambulatory Visit: Payer: Self-pay | Admitting: Internal Medicine

## 2024-08-30 ENCOUNTER — Ambulatory Visit: Payer: Self-pay | Admitting: Internal Medicine

## 2024-08-30 ENCOUNTER — Encounter: Payer: Self-pay | Admitting: Internal Medicine

## 2024-08-30 VITALS — BP 122/80 | HR 85 | Ht 64.0 in

## 2024-08-30 DIAGNOSIS — K222 Esophageal obstruction: Secondary | ICD-10-CM

## 2024-08-30 DIAGNOSIS — K219 Gastro-esophageal reflux disease without esophagitis: Secondary | ICD-10-CM | POA: Diagnosis not present

## 2024-08-30 DIAGNOSIS — K5909 Other constipation: Secondary | ICD-10-CM | POA: Diagnosis not present

## 2024-08-30 DIAGNOSIS — K5900 Constipation, unspecified: Secondary | ICD-10-CM

## 2024-08-30 DIAGNOSIS — K581 Irritable bowel syndrome with constipation: Secondary | ICD-10-CM

## 2024-08-30 MED ORDER — LINACLOTIDE 145 MCG PO CAPS: 145.0000 ug | ORAL_CAPSULE | Freq: Every day | ORAL | 3 refills | Status: AC

## 2024-08-30 NOTE — Patient Instructions (Signed)
 We have sent the following medications to your pharmacy for you to pick up at your convenience:  Linzess   Please follow up in one year.  _______________________________________________________  If your blood pressure at your visit was 140/90 or greater, please contact your primary care physician to follow up on this.  _______________________________________________________  If you are age 60 or older, your body mass index should be between 23-30. Your Body mass index is 21.97 kg/m. If this is out of the aforementioned range listed, please consider follow up with your Primary Care Provider.  If you are age 40 or younger, your body mass index should be between 19-25. Your Body mass index is 21.97 kg/m. If this is out of the aformentioned range listed, please consider follow up with your Primary Care Provider.   ________________________________________________________  The [facility] providers would like to encourage you to use [facility] to communicate with providers for non-urgent requests or questions.  Due to long hold times on the telephone, sending your provider a message by [facility] may be a faster and more efficient way to get a response.  Please allow 48 business hours for a response.  Please remember that this is for non-urgent requests.  _______________________________________________________  [facility] is using a team-based approach to care.  Your team is made up of your doctor and two to three APPS. Our APPS (Nurse Practitioners and Physician Assistants) work with your physician to ensure care continuity for you. They are fully qualified to address your health concerns and develop a treatment plan. They communicate directly with your gastroenterologist to care for you. Seeing the Advanced Practice Practitioners on your physician's team can help you by facilitating care more promptly, often allowing for earlier appointments, access to diagnostic testing, procedures, and other  specialty referrals.

## 2024-08-30 NOTE — Progress Notes (Signed)
 HISTORY OF PRESENT ILLNESS:  Nancy Kirk is a 58 y.o. female , cytology technician at [employer], who was initially seen in this office as a self-referral regarding myriad of GI complaints, May 19, 2023.  See that dictation for details.  The principal issues were GERD, intermittent solid food dysphagia, globus sensation, and chronic constipation with bloating.  She subsequently underwent colonoscopy and upper endoscopy June 18, 2023.   Colonoscopy revealed diminutive colon polyps and melanosis coli.  The polyps were adenomatous.  Follow-up in 7 years recommended.  She was given samples of Linzess  145 mcg for her constipation and bloating.   Upper endoscopy revealed mild stricturing of the distal esophagus and small hiatal hernia.  Otherwise normal.  Duodenal biopsies were normal.  She was prescribed pantoprazole  40 mg daily.  He followed up in the office August 04, 2023.  At that time, reflux symptoms are better.  Dysphagia was better.  The globus sensation continued.  And anxiety/depression was being monitored.  Linzess  helped her constipation and bloating.  Prescription given.  Follow-up in 1 year recommended.   Today she tells me that she continues with constipation.  However if she takes Linzess  daily, the problem is controlled.  She requests a refill of medication.  No new issues.  She does tell me that she does not take pantoprazole .  She is managing her reflux with diet and aloe.  No dysphagia.  No new complaints  REVIEW OF SYSTEMS:  All non-GI ROS negative except for sinus and allergy, anxiety, depression, headaches, sleeping problems, urinary frequency, urinary leakage  Past Medical History:  Diagnosis Date   ADHD (attention deficit hyperactivity disorder)    Anal fissure    Anxiety    Depression    GERD (gastroesophageal reflux disease)    Hyperlipidemia    Uterine fibroid     Past Surgical History:  Procedure Laterality Date   bladder polyp removal      TONSILLECTOMY      UTERINE FIBROID SURGERY      Social History Nancy Kirk  reports that she has never smoked. She has never used smokeless tobacco. She reports that she does not currently use alcohol after a past usage of about 4.0 standard drinks of alcohol per week. She reports that she does not use drugs.  family history includes Anxiety disorder in her mother; Depression in her mother; Diabetes in her maternal grandfather and maternal grandmother; Heart disease in her paternal grandfather and paternal grandmother; Hyperlipidemia in her mother; Hypertension in her mother; Irritable bowel syndrome in her maternal grandmother, mother, and sister.  Allergies  Allergen Reactions   Morphine Hives   Morphine And Codeine Rash    Itchy       PHYSICAL EXAMINATION: Vital signs: BP 122/80   Pulse 85   Ht 5' 4 (1.626 m)   LMP 11/17/2013   BMI 21.97 kg/m   Constitutional: generally well-appearing, no acute distress Psychiatric: alert and oriented x3, cooperative Eyes: extraocular movements intact, anicteric, conjunctiva pink Mouth: oral pharynx moist, no lesions Neck: supple no lymphadenopathy Cardiovascular: heart regular rate and rhythm, no murmur Lungs: clear to auscultation bilaterally Abdomen: soft, nontender, nondistended, no obvious ascites, no peritoneal signs, normal bowel sounds, no organomegaly Rectal: Omitted Extremities: no clubbing, cyanosis, or lower extremity edema bilaterally Skin: no lesions on visible extremities Neuro: No focal deficits.  Cranial nerves intact  ASSESSMENT:   1.  GERD.  Currently managing symptoms with diet and aloe 2.  Intermittent solid food dysphagia.  Better post balloon dilation of the distal esophagus, with mild stricturing.  No complaints at this time 3.  History of globus sensation.   4.  Chronic constipation and bloating consistent with constipation predominant IBS.  Ongoing.  Responds to Linzess  therapy.  Request refill. 5.  Recent colonoscopy with  diminutive adenomas     PLAN:   1.  Reflux precautions 2.  Can use PPI on demand 3.  Prescribe Linzess  145 mcg daily.  Multiple refills.  Medication risk reviewed. 4.  Surveillance colonoscopy in 6 years 5.  Routine office follow-up 1 year.  Contact the office in the interim for any questions or problems A total time of 30 minutes was spent preparing to see the patient, obtaining interval history, performing medically appropriate physical exam, counseling and educating the patient regarding the above listed issues, ordering medication, defining follow-up interval, and documenting clinical information in the health record

## 2024-09-02 ENCOUNTER — Other Ambulatory Visit: Payer: Self-pay | Admitting: Medical Genetics

## 2024-09-02 DIAGNOSIS — Z006 Encounter for examination for normal comparison and control in clinical research program: Secondary | ICD-10-CM
